# Patient Record
Sex: Male | Born: 1950 | Race: White | Hispanic: No | Marital: Married | Smoking: Former smoker
Health system: Southern US, Community
[De-identification: ages and names within clinical notes are randomized; demographics above are authoritative.]

## PROBLEM LIST (undated history)

## (undated) DIAGNOSIS — M160 Bilateral primary osteoarthritis of hip: Secondary | ICD-10-CM

## (undated) DIAGNOSIS — I739 Peripheral vascular disease, unspecified: Secondary | ICD-10-CM

## (undated) DIAGNOSIS — M1990 Unspecified osteoarthritis, unspecified site: Secondary | ICD-10-CM

## (undated) DIAGNOSIS — Z87442 Personal history of urinary calculi: Secondary | ICD-10-CM

## (undated) DIAGNOSIS — K219 Gastro-esophageal reflux disease without esophagitis: Secondary | ICD-10-CM

## (undated) DIAGNOSIS — N529 Male erectile dysfunction, unspecified: Secondary | ICD-10-CM

## (undated) DIAGNOSIS — I10 Essential (primary) hypertension: Secondary | ICD-10-CM

## (undated) DIAGNOSIS — F17200 Nicotine dependence, unspecified, uncomplicated: Secondary | ICD-10-CM

## (undated) DIAGNOSIS — I509 Heart failure, unspecified: Secondary | ICD-10-CM

## (undated) DIAGNOSIS — B0229 Other postherpetic nervous system involvement: Secondary | ICD-10-CM

## (undated) HISTORY — DX: Male erectile dysfunction, unspecified: N52.9

## (undated) HISTORY — PX: NO PAST SURGERIES: SHX2092

## (undated) HISTORY — DX: Essential (primary) hypertension: I10

## (undated) HISTORY — PX: COLONOSCOPY: SHX174

## (undated) HISTORY — DX: Personal history of urinary calculi: Z87.442

## (undated) HISTORY — DX: Heart failure, unspecified: I50.9

## (undated) HISTORY — DX: Other postherpetic nervous system involvement: B02.29

## (undated) HISTORY — DX: Unspecified osteoarthritis, unspecified site: M19.90

## (undated) HISTORY — DX: Bilateral primary osteoarthritis of hip: M16.0

## (undated) HISTORY — DX: Gastro-esophageal reflux disease without esophagitis: K21.9

## (undated) HISTORY — PX: MULTIPLE TOOTH EXTRACTIONS: SHX2053

## (undated) HISTORY — DX: Nicotine dependence, unspecified, uncomplicated: F17.200

## (undated) HISTORY — DX: Peripheral vascular disease, unspecified: I73.9

---

## 2016-03-04 ENCOUNTER — Encounter: Payer: Self-pay | Admitting: *Deleted

## 2016-03-04 ENCOUNTER — Other Ambulatory Visit: Payer: Self-pay | Admitting: *Deleted

## 2016-03-04 DIAGNOSIS — I739 Peripheral vascular disease, unspecified: Secondary | ICD-10-CM

## 2016-03-05 ENCOUNTER — Ambulatory Visit (HOSPITAL_COMMUNITY)
Admission: RE | Admit: 2016-03-05 | Discharge: 2016-03-05 | Disposition: A | Discharge status: Home / Self Care | Payer: Medicare Other | Source: Ambulatory Visit | Attending: Vascular Surgery | Admitting: Vascular Surgery

## 2016-03-05 ENCOUNTER — Encounter: Payer: Self-pay | Admitting: *Deleted

## 2016-03-05 ENCOUNTER — Encounter: Payer: Self-pay | Admitting: Vascular Surgery

## 2016-03-05 ENCOUNTER — Ambulatory Visit (INDEPENDENT_AMBULATORY_CARE_PROVIDER_SITE_OTHER): Payer: Medicare Other | Admitting: Vascular Surgery

## 2016-03-05 ENCOUNTER — Other Ambulatory Visit: Payer: Self-pay | Admitting: *Deleted

## 2016-03-05 VITALS — BP 131/83 | HR 87 | Temp 97.1°F | Resp 20 | Ht 71.0 in | Wt 132.3 lb

## 2016-03-05 DIAGNOSIS — I739 Peripheral vascular disease, unspecified: Secondary | ICD-10-CM | POA: Insufficient documentation

## 2016-03-05 DIAGNOSIS — I70213 Atherosclerosis of native arteries of extremities with intermittent claudication, bilateral legs: Secondary | ICD-10-CM | POA: Diagnosis not present

## 2016-03-05 LAB — VAS US LOWER EXTREMITY ARTERIAL DUPLEX
LPERODISTSYS: 29 cm/s
LSFMPSV: -52 cm/s
LSFPPSV: -68 cm/s
Left ant tibial distal sys: -10 cm/s
Left super femoral dist sys PSV: -37 cm/s
RATIBDISTSYS: -11 cm/s
RTIBDISTSYS: 28 cm/s
Right peroneal sys PSV: 22 cm/s
Right super femoral dist sys PSV: -39 cm/s
Right super femoral mid sys PSV: -57 cm/s
Right super femoral prox sys PSV: -61 cm/s
left post tibial dist sys: -26 cm/s

## 2016-03-05 NOTE — Progress Notes (Signed)
  Referred by:  Nathan Conroy, PA-C [address] [place], [place] [address]  Reason for referral: bilateral leg pain   History of Present Illness  Gregory Russell is a 65 y.o. (11/30/1950) male who presents with chief complaint: pain in thighs with climbing stairs and walking.   Onset of symptom occurred years ago, but worsening over last few months.  Pain is described as vague aching in hips and thighs, severity 5-10/10, and associated with climb steps and 1 block walking.  Patient has attempted to treat this pain with rest.  The patient has no rest pain symptoms also and no leg wounds/ulcers.  Atherosclerotic risk factors include: HTN, active smoking.   Past Medical History:  Diagnosis Date  . Arthritis   . CHF (congestive heart failure) (HCC)   . Erectile dysfunction   . GERD (gastroesophageal reflux disease)   . Hypertension   . Peripheral vascular disease (HCC)    claudication  . Personal history of kidney stones   . Post herpetic neuralgia   . Primary osteoarthritis of both hips   . Tobacco dependence     Past Surgical History:  Procedure Laterality Date  . NO PAST SURGERIES     as of 03-03-16    Social History   Social History  . Marital status: Married    Spouse name: N/A  . Number of children: N/A  . Years of education: N/A   Occupational History  . Not on file.   Social History Main Topics  . Smoking status: Current Every Day Smoker    Packs/day: 1.00  . Smokeless tobacco: Never Used  . Alcohol use No  . Drug use: No  . Sexual activity: Not on file   Other Topics Concern  . Not on file   Social History Narrative  . No narrative on file    Family History  Problem Relation Age of Onset  . Alzheimer's disease Mother   . Heart disease Father     Current Outpatient Prescriptions  Medication Sig Dispense Refill  . amLODipine (NORVASC) 10 MG tablet Take 10 mg by mouth daily.    . losartan (COZAAR) 50 MG tablet Take 100 mg by mouth daily.     .  omeprazole (PRILOSEC) 40 MG capsule Take 40 mg by mouth daily.    . sildenafil (VIAGRA) 100 MG tablet Take 100 mg by mouth daily as needed for erectile dysfunction.     No current facility-administered medications for this visit.     No Known Allergies   REVIEW OF SYSTEMS:   Cardiac:  positive for: no symptoms, negative for: Chest pain or chest pressure, Shortness of breath upon exertion and Shortness of breath when lying flat,   Vascular:  positive for: Pain in calf, thigh, or hip brought on by ambulation,  negative for: Pain in feet at night that wakes you up from your sleep, Blood clot in your veins and Leg swelling  Pulmonary:  positive for: no symptoms,  negative for: Oxygen at home, Productive cough and Wheezing  Neurologic:  positive for: No symptoms, negative for: Sudden weakness in arms or legs, Sudden numbness in arms or legs, Sudden onset of difficulty speaking or slurred speech, Temporary loss of vision in one eye and Problems with dizziness  Gastrointestinal:  positive for: no symptoms, negative for: Blood in stool and Vomited blood  Genitourinary:  positive for: no symptoms, negative for: Burning when urinating and Blood in urine  Psychiatric:  positive for: no symptoms,    negative for: Major depression  Hematologic:  positive for: no symptoms,  negative for: negative for: Bleeding problems and Problems with blood clotting too easily  Dermatologic:  positive for: no symptoms, negative for: Rashes or ulcers  Constitutional:  positive for: no symptoms, negative for: Fever or chills  Ear/Nose/Throat:  positive for: no symptoms, negative for: Change in hearing, Nose bleeds and Sore throat  Musculoskeletal:  positive for: no symptoms, negative for: Back pain, Joint pain and Muscle pain    For VQI Use Only  PRE-ADM LIVING: Home  AMB STATUS: Ambulatory  CAD Sx: None  PRIOR CHF: None  STRESS TEST: No   Physical Examination  Vitals:    03/05/16 0927  BP: 131/83  Pulse: 87  Resp: 20  Temp: 97.1 F (36.2 C)  TempSrc: Oral  SpO2: 98%  Weight: 132 lb 4.8 oz (60 kg)  Height: 5' 11" (1.803 m)    Body mass index is 18.45 kg/m.  General: Alert, O x 3, WD,NAD  Head: [place]/AT,   Ear/Nose/Throat: Hearing grossly intact, nares without erythema or drainage, oropharynx without Erythema or Exudate , Mallampati score: 3, Dentition intact  Eyes: PERRLA, EOMI,   Neck: Supple, mid-line trachea,    Pulmonary: Sym exp, good B air movt,CTA B  Cardiac: RRR, Nl S1, S2, no Murmurs, No rubs, No S3,S4  Vascular: Vessel Right Left  Radial Palpable Palpable  Brachial Palpable Palpable  Carotid Palpable, No Bruit Palpable, No Bruit  Aorta Not palpable N/A  Femoral Palpable Palpable (weaker than right)  Popliteal Not palpable Not palpable  PT Not palpable Not palpable  DP Faintly palpable Faintly palpable   Gastrointestinal: soft, non-distended, non-tender to palpation, No guarding or rebound, no HSM, no masses, no CVAT B, No palpable prominent aortic pulse,    Musculoskeletal: M/S 5/5 throughout  , Extremities without ischemic changes  , No edema present,  , No LDS present  Neurologic: CN 2-12 intact , Pain and light touch intact in extremities , Motor exam as listed above  Psychiatric: Judgement intact, Mood & affect appropriate for pt's clinical situation  Dermatologic: See M/S exam for extremity exam, No rashes otherwise noted  Lymph : Palpable lymph nodes: None   Non-Invasive Vascular Imaging  Outside ABI (Date: 01/28/16)  R:   ABI: 0.81  L:   ABI: 0.71  BLE arterial duplex (03/05/2016)  R:   CFA: bi  PFA: mono  SFA: bi  Pop: bi  PT: bi  Pero: bi  AT: mono  L:  CFA: mono  PFA: mono  SFA: mono-bi  Pop: mono  PT: mono  Pero: bi  AT: mono   Outside Studies/Documentation 10 pages of outside documents were reviewed including: outside PCP chart, ABI.  Medical Decision  Making  Gregory Russell is a 65 y.o. male who presents with: BLE short distance lifestyle limiting intermittent claudication.   Based on this patient's history and physical exam, I recommend: maximal medical mgmt.  The patient feels his sx are severe enough to justify proceeding with angiography, so we discussed: aortogram, bilateral leg runoff, and possible iliac intervention. I discussed with the patient the nature of angiographic procedures, especially the limited patencies of any endovascular intervention.   The patient is aware of that the risks of an angiographic procedure include but are not limited to: bleeding, infection, access site complications, renal failure, embolization, rupture of vessel, dissection, arteriovenous fistula, possible need for emergent surgical intervention, possible need for surgical procedures to treat the patient's pathology, anaphylactic   reaction to contrast, and stroke and death.   The patient is aware of the risks and agrees to proceed.  He is scheduled for 1 FEB 17  I discussed with the patient the natural history of intermittent claudication: 75% of patients have stable or improved symptoms in a year an only 2% require amputation. Eventually 20% may require intervention in a year.  I discussed in depth with the patient the nature of atherosclerosis, and emphasized the importance of maximal medical management including strict control of blood pressure, blood glucose, and lipid levels, antiplatelet agent, obtaining regular exercise, and cessation of smoking.    The patient is aware that without maximal medical management the underlying atherosclerotic disease process will progress, limiting the benefit of any interventions.  I discussed in depth with the patient a walking plan and how to execute such. The patient is currently not on a statin as not medically indicated. The patient is currently not on an anti-platelet.  Patient will be started on ASA 81 mg PO  daily  Thank you for allowing us to participate in this patient's care.   Kashus Karlen, MD, FACS [facility] Office: [phone] Pager: [phone]  03/05/2016, 11:43 AM   

## 2016-03-13 ENCOUNTER — Encounter (HOSPITAL_COMMUNITY): Payer: Self-pay | Admitting: Vascular Surgery

## 2016-03-13 ENCOUNTER — Encounter (HOSPITAL_COMMUNITY)
Admission: RE | Disposition: A | Discharge status: Home / Self Care | Payer: Self-pay | Source: Ambulatory Visit | Attending: Vascular Surgery

## 2016-03-13 ENCOUNTER — Ambulatory Visit (HOSPITAL_COMMUNITY)
Admission: RE | Admit: 2016-03-13 | Discharge: 2016-03-13 | Disposition: A | Discharge status: Home / Self Care | Payer: Medicare Other | Source: Ambulatory Visit | Attending: Vascular Surgery | Admitting: Vascular Surgery

## 2016-03-13 ENCOUNTER — Other Ambulatory Visit: Payer: Self-pay | Admitting: *Deleted

## 2016-03-13 DIAGNOSIS — M160 Bilateral primary osteoarthritis of hip: Secondary | ICD-10-CM | POA: Diagnosis not present

## 2016-03-13 DIAGNOSIS — Z8249 Family history of ischemic heart disease and other diseases of the circulatory system: Secondary | ICD-10-CM | POA: Diagnosis not present

## 2016-03-13 DIAGNOSIS — I70219 Atherosclerosis of native arteries of extremities with intermittent claudication, unspecified extremity: Secondary | ICD-10-CM

## 2016-03-13 DIAGNOSIS — F17210 Nicotine dependence, cigarettes, uncomplicated: Secondary | ICD-10-CM | POA: Insufficient documentation

## 2016-03-13 DIAGNOSIS — I70213 Atherosclerosis of native arteries of extremities with intermittent claudication, bilateral legs: Secondary | ICD-10-CM | POA: Insufficient documentation

## 2016-03-13 DIAGNOSIS — I110 Hypertensive heart disease with heart failure: Secondary | ICD-10-CM | POA: Insufficient documentation

## 2016-03-13 DIAGNOSIS — I509 Heart failure, unspecified: Secondary | ICD-10-CM | POA: Diagnosis not present

## 2016-03-13 DIAGNOSIS — K219 Gastro-esophageal reflux disease without esophagitis: Secondary | ICD-10-CM | POA: Diagnosis not present

## 2016-03-13 DIAGNOSIS — N529 Male erectile dysfunction, unspecified: Secondary | ICD-10-CM | POA: Diagnosis not present

## 2016-03-13 DIAGNOSIS — Z01810 Encounter for preprocedural cardiovascular examination: Secondary | ICD-10-CM

## 2016-03-13 DIAGNOSIS — Z87442 Personal history of urinary calculi: Secondary | ICD-10-CM | POA: Insufficient documentation

## 2016-03-13 DIAGNOSIS — I700 Atherosclerosis of aorta: Secondary | ICD-10-CM | POA: Insufficient documentation

## 2016-03-13 DIAGNOSIS — I739 Peripheral vascular disease, unspecified: Secondary | ICD-10-CM

## 2016-03-13 HISTORY — PX: PERIPHERAL VASCULAR CATHETERIZATION: SHX172C

## 2016-03-13 SURGERY — ABDOMINAL AORTOGRAM W/LOWER EXTREMITY
Anesthesia: LOCAL

## 2016-03-13 MED ORDER — SODIUM CHLORIDE 0.9 % IV SOLN: 1.0000 mL/kg/h | INTRAVENOUS | Status: DC

## 2016-03-13 MED ORDER — LIDOCAINE HCL (PF) 1 % IJ SOLN
INTRAMUSCULAR | Status: DC | PRN
  Administered 2016-03-13: 17 mL

## 2016-03-13 MED ORDER — FENTANYL CITRATE (PF) 100 MCG/2ML IJ SOLN
INTRAMUSCULAR | Status: DC | PRN
  Administered 2016-03-13: 25 ug via INTRAVENOUS

## 2016-03-13 MED ORDER — MIDAZOLAM HCL 2 MG/2ML IJ SOLN
INTRAMUSCULAR | Status: AC
  Filled 2016-03-13: qty 2

## 2016-03-13 MED ORDER — ASPIRIN 81 MG PO CHEW: 81.0000 mg | CHEWABLE_TABLET | Freq: Every day | ORAL | 3 refills | Status: AC

## 2016-03-13 MED ORDER — LIDOCAINE HCL (PF) 1 % IJ SOLN
INTRAMUSCULAR | Status: AC
  Filled 2016-03-13: qty 30

## 2016-03-13 MED ORDER — IODIXANOL 320 MG/ML IV SOLN
INTRAVENOUS | Status: DC | PRN
  Administered 2016-03-13: 97 mL via INTRA_ARTERIAL

## 2016-03-13 MED ORDER — MIDAZOLAM HCL 2 MG/2ML IJ SOLN
INTRAMUSCULAR | Status: DC | PRN
  Administered 2016-03-13: 0.5 mg via INTRAVENOUS

## 2016-03-13 MED ORDER — FENTANYL CITRATE (PF) 100 MCG/2ML IJ SOLN
INTRAMUSCULAR | Status: AC
  Filled 2016-03-13: qty 2

## 2016-03-13 MED ORDER — SODIUM CHLORIDE 0.9 % IV SOLN
INTRAVENOUS | Status: DC
  Administered 2016-03-13: 08:00:00 via INTRAVENOUS

## 2016-03-13 MED ORDER — HEPARIN (PORCINE) IN NACL 2-0.9 UNIT/ML-% IJ SOLN
INTRAMUSCULAR | Status: AC
  Filled 2016-03-13: qty 1000

## 2016-03-13 MED ORDER — HEPARIN (PORCINE) IN NACL 2-0.9 UNIT/ML-% IJ SOLN
INTRAMUSCULAR | Status: DC | PRN
  Administered 2016-03-13: 1000 mL

## 2016-03-13 SURGICAL SUPPLY — 9 items
CATH OMNI FLUSH 5F 65CM (CATHETERS) ×4 IMPLANT
COVER PRB 48X5XTLSCP FOLD TPE (BAG) ×1 IMPLANT
COVER PROBE 5X48 (BAG) ×1
KIT PV (KITS) ×2 IMPLANT
SHEATH PINNACLE 5F 10CM (SHEATH) ×2 IMPLANT
SYR MEDRAD MARK V 150ML (SYRINGE) ×2 IMPLANT
TRANSDUCER W/STOPCOCK (MISCELLANEOUS) ×2 IMPLANT
TRAY PV CATH (CUSTOM PROCEDURE TRAY) ×2 IMPLANT
WIRE BENTSON .035X145CM (WIRE) ×2 IMPLANT

## 2016-03-13 NOTE — Interval H&P Note (Signed)
[facility]  History and Physical Update  The patient was interviewed and re-examined.  The patient's previous History and Physical has been reviewed and is unchanged from my consult.  There is no change in the plan of care: aortogram, bilateral leg runoff, and possible iliac intervention.   I discussed with the patient the nature of angiographic procedures, especially the limited patencies of any endovascular intervention.    The patient is aware of that the risks of an angiographic procedure include but are not limited to: bleeding, infection, access site complications, renal failure, embolization, rupture of vessel, dissection, arteriovenous fistula, possible need for emergent surgical intervention, possible need for surgical procedures to treat the patient's pathology, anaphylactic reaction to contrast, and stroke and death.    The patient is aware of the risks and agrees to proceed.   Leonides SakeBrian Chen, MD [facility] [place] Office: [phone] Pager: [phone]  03/13/2016, 7:39 AM

## 2016-03-13 NOTE — H&P (View-Only) (Signed)
Referred by:  Lonie Peak, PA-C [address] [place], [place] [address]  Reason for referral: bilateral leg pain   History of Present Illness  Gregory Russell is a 66 y.o. (1950-04-05) male who presents with chief complaint: pain in thighs with climbing stairs and walking.   Onset of symptom occurred years ago, but worsening over last few months.  Pain is described as vague aching in hips and thighs, severity 5-10/10, and associated with climb steps and 1 block walking.  Patient has attempted to treat this pain with rest.  The patient has no rest pain symptoms also and no leg wounds/ulcers.  Atherosclerotic risk factors include: HTN, active smoking.   Past Medical History:  Diagnosis Date  . Arthritis   . CHF (congestive heart failure) (HCC)   . Erectile dysfunction   . GERD (gastroesophageal reflux disease)   . Hypertension   . Peripheral vascular disease (HCC)    claudication  . Personal history of kidney stones   . Post herpetic neuralgia   . Primary osteoarthritis of both hips   . Tobacco dependence     Past Surgical History:  Procedure Laterality Date  . NO PAST SURGERIES     as of 03-03-16    Social History   Social History  . Marital status: Married    Spouse name: N/A  . Number of children: N/A  . Years of education: N/A   Occupational History  . Not on file.   Social History Main Topics  . Smoking status: Current Every Day Smoker    Packs/day: 1.00  . Smokeless tobacco: Never Used  . Alcohol use No  . Drug use: No  . Sexual activity: Not on file   Other Topics Concern  . Not on file   Social History Narrative  . No narrative on file    Family History  Problem Relation Age of Onset  . Alzheimer's disease Mother   . Heart disease Father     Current Outpatient Prescriptions  Medication Sig Dispense Refill  . amLODipine (NORVASC) 10 MG tablet Take 10 mg by mouth daily.    Marland Kitchen losartan (COZAAR) 50 MG tablet Take 100 mg by mouth daily.     Marland Kitchen  omeprazole (PRILOSEC) 40 MG capsule Take 40 mg by mouth daily.    . sildenafil (VIAGRA) 100 MG tablet Take 100 mg by mouth daily as needed for erectile dysfunction.     No current facility-administered medications for this visit.     No Known Allergies   REVIEW OF SYSTEMS:   Cardiac:  positive for: no symptoms, negative for: Chest pain or chest pressure, Shortness of breath upon exertion and Shortness of breath when lying flat,   Vascular:  positive for: Pain in calf, thigh, or hip brought on by ambulation,  negative for: Pain in feet at night that wakes you up from your sleep, Blood clot in your veins and Leg swelling  Pulmonary:  positive for: no symptoms,  negative for: Oxygen at home, Productive cough and Wheezing  Neurologic:  positive for: No symptoms, negative for: Sudden weakness in arms or legs, Sudden numbness in arms or legs, Sudden onset of difficulty speaking or slurred speech, Temporary loss of vision in one eye and Problems with dizziness  Gastrointestinal:  positive for: no symptoms, negative for: Blood in stool and Vomited blood  Genitourinary:  positive for: no symptoms, negative for: Burning when urinating and Blood in urine  Psychiatric:  positive for: no symptoms,  negative for: Major depression  Hematologic:  positive for: no symptoms,  negative for: negative for: Bleeding problems and Problems with blood clotting too easily  Dermatologic:  positive for: no symptoms, negative for: Rashes or ulcers  Constitutional:  positive for: no symptoms, negative for: Fever or chills  Ear/Nose/Throat:  positive for: no symptoms, negative for: Change in hearing, Nose bleeds and Sore throat  Musculoskeletal:  positive for: no symptoms, negative for: Back pain, Joint pain and Muscle pain    For VQI Use Only  PRE-ADM LIVING: Home  AMB STATUS: Ambulatory  CAD Sx: None  PRIOR CHF: None  STRESS TEST: No   Physical Examination  Vitals:    03/05/16 0927  BP: 131/83  Pulse: 87  Resp: 20  Temp: 97.1 F (36.2 C)  TempSrc: Oral  SpO2: 98%  Weight: 132 lb 4.8 oz (60 kg)  Height: 5\' 11"  (1.803 m)    Body mass index is 18.45 kg/m.  General: Alert, O x 3, WD,NAD  Head: [place]/AT,   Ear/Nose/Throat: Hearing grossly intact, nares without erythema or drainage, oropharynx without Erythema or Exudate , Mallampati score: 3, Dentition intact  Eyes: PERRLA, EOMI,   Neck: Supple, mid-line trachea,    Pulmonary: Sym exp, good B air movt,CTA B  Cardiac: RRR, Nl S1, S2, no Murmurs, No rubs, No S3,S4  Vascular: Vessel Right Left  Radial Palpable Palpable  Brachial Palpable Palpable  Carotid Palpable, No Bruit Palpable, No Bruit  Aorta Not palpable N/A  Femoral Palpable Palpable (weaker than right)  Popliteal Not palpable Not palpable  PT Not palpable Not palpable  DP Faintly palpable Faintly palpable   Gastrointestinal: soft, non-distended, non-tender to palpation, No guarding or rebound, no HSM, no masses, no CVAT B, No palpable prominent aortic pulse,    Musculoskeletal: M/S 5/5 throughout  , Extremities without ischemic changes  , No edema present,  , No LDS present  Neurologic: CN 2-12 intact , Pain and light touch intact in extremities , Motor exam as listed above  Psychiatric: Judgement intact, Mood & affect appropriate for pt's clinical situation  Dermatologic: See M/S exam for extremity exam, No rashes otherwise noted  Lymph : Palpable lymph nodes: None   Non-Invasive Vascular Imaging  Outside ABI (Date: 01/28/16)  R:   ABI: 0.81  L:   ABI: 0.71  BLE arterial duplex (03/05/2016)  R:   CFA: bi  PFA: mono  SFA: bi  Pop: bi  PT: bi  Pero: bi  AT: mono  L:  CFA: mono  PFA: mono  SFA: mono-bi  Pop: mono  PT: mono  Pero: bi  AT: mono   Outside Studies/Documentation 10 pages of outside documents were reviewed including: outside PCP chart, ABI.  Medical Decision  Making  Gregory Russell is a 66 y.o. male who presents with: BLE short distance lifestyle limiting intermittent claudication.   Based on this patient's history and physical exam, I recommend: maximal medical mgmt.  The patient feels his sx are severe enough to justify proceeding with angiography, so we discussed: aortogram, bilateral leg runoff, and possible iliac intervention. I discussed with the patient the nature of angiographic procedures, especially the limited patencies of any endovascular intervention.   The patient is aware of that the risks of an angiographic procedure include but are not limited to: bleeding, infection, access site complications, renal failure, embolization, rupture of vessel, dissection, arteriovenous fistula, possible need for emergent surgical intervention, possible need for surgical procedures to treat the patient's pathology, anaphylactic  reaction to contrast, and stroke and death.   The patient is aware of the risks and agrees to proceed.  He is scheduled for 1 FEB 17  I discussed with the patient the natural history of intermittent claudication: 75% of patients have stable or improved symptoms in a year an only 2% require amputation. Eventually 20% may require intervention in a year.  I discussed in depth with the patient the nature of atherosclerosis, and emphasized the importance of maximal medical management including strict control of blood pressure, blood glucose, and lipid levels, antiplatelet agent, obtaining regular exercise, and cessation of smoking.    The patient is aware that without maximal medical management the underlying atherosclerotic disease process will progress, limiting the benefit of any interventions.  I discussed in depth with the patient a walking plan and how to execute such. The patient is currently not on a statin as not medically indicated. The patient is currently not on an anti-platelet.  Patient will be started on ASA 81 mg PO  daily  Thank you for allowing [place] to participate in this patient's care.   Leonides Sake, MD, FACS [facility] Office: [phone] Pager: [phone]  03/05/2016, 11:43 AM

## 2016-03-13 NOTE — Progress Notes (Signed)
Site area: Right groin a 5 french arterial sheet was removed  Site Prior to Removal:  Level 0  Pressure Applied For 15 MINUTES    Bedrest Beginning at 0950a  Manual:   Yes.    Patient Status During Pull:  stable  Post Pull Groin Site:  Level 0  Post Pull Instructions Given:  Yes.    Post Pull Pulses Present:  Yes.    Dressing Applied:  Yes.    Comments:  VS remain stable during sheath pull.

## 2016-03-13 NOTE — Discharge Instructions (Signed)

## 2016-03-13 NOTE — Op Note (Signed)
OPERATIVE NOTE   PROCEDURE: 1.  Right common femoral artery cannulation under ultrasound guidance 2.  Placement of catheter in aorta 3.  Aortogram 4.  Conscious sedation for 14 minutes 5.  Bilateral leg runoff via catheter  PRE-OPERATIVE DIAGNOSIS: lifestyle limiting intermittent claudication   POST-OPERATIVE DIAGNOSIS: same as above   SURGEON: Leonides SakeBrian Jrue Yambao, MD  ANESTHESIA: conscious sedation  ESTIMATED BLOOD LOSS: 30 cc  CONTRAST: 97 cc  FINDING(S):  Aorta: patent, distal aorta tapers (too small for two stents), heavy calcified distally  Superior mesenteric artery: not visualized  Celiac artery: not visualized Inferior mesenteric artery: patent, origin appears to be tapered   Right Left  RA patent patent  CIA Patent, 75-90% takeoff stenosis Patent, 75-90% takeoff stenosis    EIA Patent, somewhat underfilled Patent  IIA patent Patent  CFA Patent Patent  SFA Patent Patent  PFA Patent Patent  Pop Patent Patent  Trif Patent Patent, hypertrophied,   AT Patent proximally, dwindles distally Patent, incompletely visualized distally as as superimposed with peroneal artery  Pero Patent, underfills distally Patent, dominant runoff  PT Patent, dominant runoff Occluded, no obvious takeoff   SPECIMEN(S):  none  INDICATIONS:   Gregory Russell is a 66 y.o. male who presents with lifestyle limiting short distance intermittent claudication.  The patient presents for: aortogram, bilateral leg runoff, and possible intervention.  I discussed with the patient the nature of angiographic procedures, especially the limited patencies of any endovascular intervention.  The patient is aware of that the risks of an angiographic procedure include but are not limited to: bleeding, infection, access site complications, renal failure, embolization, rupture of vessel, dissection, possible need for emergent surgical intervention, possible need for surgical procedures to treat the patient's pathology,  and stroke and death.  The patient is aware of the risks and agrees to proceed.  DESCRIPTION: After full informed consent was obtained from the patient, the patient was brought back to the angiography suite.  The patient was placed supine upon the angiography table and connected to cardiopulmonary monitoring equipment.  The patient was then given conscious sedation, the amounts of which are documented in the patient's chart.  A circulating radiologic technician maintained continuous monitoring of the patient's cardiopulmonary status.  Additionally, the control room radiologic technician provided backup monitoring throughout the procedure.  The patient was prepped and drape in the standard fashion for an angiographic procedure.  At this point, attention was turned to the right groin.  Under ultrasound guidance,  The subcutaneous tissue surrounding the right common femoral artery was anesthesized with 1% lidocaine with epinephrine.  The artery was then cannulated with a 18 gauge needle.  The Bentson wire was passed up into the aorta.  The needle was exchanged for a 5-Fr sheath, which was advanced over the wire into the common femoral artery.  The dilator was then removed.  The Omniflush catheter was then loaded over the wire up to the level of L1.  The catheter was connected to the power injector circuit.  After de-airring and de-clotting the circuit, a power injector aortogram was completed.  The findings are listed above.  Based on a distal aortic measurement, 9 mm at 1 cm proximal to the severe aortic stenosis, it is not possible to place two iliac stents without possibly splitting the aorta.  I pulled the catheter down to the distal aorta.  An automated bilateral leg runoff was completed.  I replaced the wire into the catheter, straightening out the crook in the catheter.  The findings are listed above.  Both were removed from the sheath together.  The sheath was aspirated.  No clots were present and the  sheath was reloaded with heparinized saline.    Based on the images, the patient might be a candidate for aorto-bi-iliac bypass versus aortobifemoral bypass.   COMPLICATIONS: none  CONDITION: stable  Leonides Sake, MD, [facility] Office: [phone] Pager: [phone]  03/13/2016, 9:32 AM

## 2016-03-14 LAB — POCT I-STAT, CHEM 8
BUN: 23 mg/dL — ABNORMAL HIGH (ref 6–20)
CHLORIDE: 103 mmol/L (ref 101–111)
CREATININE: 1 mg/dL (ref 0.61–1.24)
Calcium, Ion: 1.21 mmol/L (ref 1.15–1.40)
GLUCOSE: 99 mg/dL (ref 65–99)
HCT: 47 % (ref 39.0–52.0)
Hemoglobin: 16 g/dL (ref 13.0–17.0)
POTASSIUM: 3.8 mmol/L (ref 3.5–5.1)
Sodium: 140 mmol/L (ref 135–145)
TCO2: 29 mmol/L (ref 0–100)

## 2016-04-08 ENCOUNTER — Telehealth: Payer: Self-pay | Admitting: Vascular Surgery

## 2016-04-08 NOTE — Telephone Encounter (Signed)
Per Dr. Nicky Pughhen's request, patient was referred to Dr[facility] Eldred MangesPrabhat Kumar [facility] to have pre-op clearance. The patient had this appointment on 2/8 and notes should be faxed to our office.

## 2016-04-21 ENCOUNTER — Encounter: Payer: Self-pay | Admitting: Vascular Surgery

## 2016-04-28 NOTE — Progress Notes (Signed)
Established Intermittent Claudication  History of Present Illness  Gregory Russell is a 66 y.o. (1950-12-11) male who presents with chief complaint: lifestyle limiting claudication.  The patient's symptoms have progressed.  The patient's symptoms are: severe short distance intermittent claudication which limits his ability to work.  The patiCorey Russell's treatment regimen currently included: maximal medical management.  The patient returns from his Cardiologist with a normal nuclear stress testing.  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  Based on imaging, the patient was a candidate for an aorto-bi-iliac vs ABF bypass.    Past Medical History:  Diagnosis Date  . Arthritis   . CHF (congestive heart failure) (HCC)   . Erectile dysfunction   . GERD (gastroesophageal reflux disease)   . Hypertension   . Peripheral vascular disease (HCC)    claudication  . Personal history of kidney stones   . Post herpetic neuralgia   . Primary osteoarthritis of both hips   . Tobacco dependence     Past Surgical History:  Procedure Laterality Date  . NO PAST SURGERIES     as of 03-03-16  . PERIPHERAL VASCULAR CATHETERIZATION N/A 03/13/2016   Procedure: Abdominal Aortogram w/Lower Extremity;  Surgeon: Gregory HertzBrian L Augusta Mirkin, MD;  [facility];  Service: Cardiovascular;  Laterality: N/A;    Social History   Social History  . Marital status: Married    Spouse name: N/A  . Number of children: N/A  . Years of education: N/A   Occupational History  . Not on file.   Social History Main Topics  . Smoking status: Current Every Day Smoker    Packs/day: 1.00  . Smokeless tobacco: Never Used  . Alcohol use No  . Drug use: No  . Sexual activity: Not on file   Other Topics Concern  . Not on file   Social History Narrative  . No narrative on file    Family History  Problem Relation Age of Onset  . Alzheimer's  disease Mother   . Heart disease Father     Current Outpatient Prescriptions  Medication Sig Dispense Refill  . amLODipine (NORVASC) 10 MG tablet Take 10 mg by mouth daily.    Marland Kitchen. aspirin (ASPIRIN CHILDRENS) 81 MG chewable tablet Chew 1 tablet (81 mg total) by mouth daily. 90 tablet 3  . losartan (COZAAR) 50 MG tablet Take 100 mg by mouth daily.     Marland Kitchen. omeprazole (PRILOSEC) 40 MG capsule Take 40 mg by mouth daily.    . sildenafil (VIAGRA) 100 MG tablet Take 100 mg by mouth daily as needed for erectile dysfunction.     No current facility-administered medications for this visit.      No Known Allergies   REVIEW OF SYSTEMS:  (Positives checked otherwise negative)  CARDIOVASCULAR:   [ ]  chest pain,  [ ]  chest pressure,  [ ]  palpitations,  [ ]  shortness of breath when laying flat,  [ ]  shortness of breath with exertion,   [x]  pain in feet when walking,  [ ]  pain in feet when laying flat, [ ]  history of blood clot in veins (DVT),  [ ]  history of phlebitis,  [ ]  swelling in legs,  [ ]  varicose veins  PULMONARY:   [ ]  productive cough,  [ ]  asthma,  [ ]  wheezing  NEUROLOGIC:   [ ]  weakness in arms or legs,  [ ]  numbness in arms or legs,  [ ]   difficulty speaking or slurred speech,  [ ]  temporary loss of vision in one eye,  [ ]  dizziness  HEMATOLOGIC:   [ ]  bleeding problems,  [ ]  problems with blood clotting too easily  MUSCULOSKEL:   [ ]  joint pain, [ ]  joint swelling  GASTROINTEST:   [ ]  vomiting blood,  [ ]  blood in stool     GENITOURINARY:   [ ]  burning with urination,  [ ]  blood in urine  PSYCHIATRIC:   [ ]  history of major depression  INTEGUMENTARY:   [ ]  rashes,  [ ]  ulcers  CONSTITUTIONAL:   [ ]  fever,  [ ]  chills   Physical Examination  Vitals:   05/02/16 1318  BP: 114/75  Pulse: 83  Resp: 16  Temp: 97.8 F (36.6 C)  TempSrc: Oral  SpO2: 94%  Weight: 137 lb (62.1 kg)  Height: 5\' 11"  (1.803 m)    Body mass index is 19.11  kg/m.  General: Alert, O x 3, WD,NAD  Pulmonary: Sym exp, good B air movt,CTA B  Cardiac: RRR, Nl S1, S2, no Murmurs, No rubs, No S3,S4  Vascular: Vessel Right Left  Radial Palpable Palpable  Brachial Palpable Palpable  Carotid Palpable, No Bruit Palpable, No Bruit  Aorta Not palpable N/A  Femoral Faintly palpable Faintly palpable  Popliteal Not palpable Not palpable  PT Not palpable Not palpable  DP Not palpable Not palpable   Gastrointestinal: soft, non-distended, non-tender to palpation, No guarding or rebound, no HSM, no masses, no CVAT B, No palpable prominent aortic pulse,    Musculoskeletal: M/S 5/5 throughout  , Extremities without ischemic changes  , No edema present,  , No LDS present  Neurologic: CN 2-12 intact , Pain and light touch intact in extremities , Motor exam as listed above   Non-Invasive Vascular Imaging Outside ABI R: 0.81 L: 0.71   Medical Decision Making  Gregory Russell is a 66 y.o. 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   Given this patient's anatomy, when his distal aorta occludes, he will likely have bilateral threatened limb.  To avoid this potentially fatal outcome, I have offered the patient: aortobi-iliac bypass, possible distal aortic endarterectomy, possible aortobifemoral bypass.  The patient is aware that his anatomy will determine the procedure completed intraoperatively. The patient is aware the risks of aortic surgery include but are not limited to: bleeding, need for transfusion, infection, death, stroke, paralysis, wound complications, bowel injuries, impotence, bowel ischemia, extended ventilation and future ventral hernias.   Overall, I cited a mortality rate of 5-10% and morbidity rate of 30%..  I discussed in depth with the patient the nature of  atherosclerosis, and emphasized the importance of maximal medical management including strict control of blood pressure, blood glucose, and lipid levels, antiplatelet agents, obtaining regular exercise, and cessation of smoking.    The patient is aware that without maximal medical management the underlying atherosclerotic disease process will progress, limiting the benefit of any interventions. The patient is currently not on a statin. Patient will be started on Lipitor 10 mg PO daily, to be titrated and managed by their primary physician.  The patient is currently on an anti-platelet: ASA.  Smoking cessation was emphasized to this patient.  The patient is aware he is at higher risk of mortality, morbidities, and decreased patency with continued smoking.  Thank you for allowing  [place] to participate in this patient's care.   Leonides Sake, MD, FACS [facility] [place] Office: [phone] Pager: [phone]

## 2016-05-02 ENCOUNTER — Other Ambulatory Visit: Payer: Self-pay

## 2016-05-02 ENCOUNTER — Ambulatory Visit (INDEPENDENT_AMBULATORY_CARE_PROVIDER_SITE_OTHER): Payer: Medicare Other | Admitting: Vascular Surgery

## 2016-05-02 ENCOUNTER — Encounter: Payer: Self-pay | Admitting: Vascular Surgery

## 2016-05-02 VITALS — BP 114/75 | HR 83 | Temp 97.8°F | Resp 16 | Ht 71.0 in | Wt 137.0 lb

## 2016-05-02 DIAGNOSIS — I70213 Atherosclerosis of native arteries of extremities with intermittent claudication, bilateral legs: Secondary | ICD-10-CM

## 2016-05-05 ENCOUNTER — Encounter (HOSPITAL_COMMUNITY): Payer: Self-pay | Admitting: *Deleted

## 2016-05-05 MED ORDER — SODIUM CHLORIDE 0.9 % IV SOLN: INTRAVENOUS | Status: DC

## 2016-05-05 MED ORDER — DEXTROSE 5 % IV SOLN
1.5000 g | INTRAVENOUS | Status: AC
  Administered 2016-05-06 (×2): 1.5 g via INTRAVENOUS
  Filled 2016-05-05: qty 1.5

## 2016-05-05 NOTE — Anesthesia Preprocedure Evaluation (Signed)
Anesthesia Evaluation  Patient identified by MRN, date of birth, ID band Patient awake    Reviewed: Allergy & Precautions, NPO status , Patient's Chart, lab work & pertinent test results  Airway Mallampati: II  TM Distance: >3 FB Neck ROM: Full    Dental no notable dental hx.    Pulmonary neg pulmonary ROS, Current Smoker,    Pulmonary exam normal breath sounds clear to auscultation       Cardiovascular hypertension, Pt. on medications + Peripheral Vascular Disease and +CHF  Normal cardiovascular exam Rhythm:Regular Rate:Normal     Neuro/Psych negative neurological ROS  negative psych ROS   GI/Hepatic Neg liver ROS, GERD  ,  Endo/Other  negative endocrine ROS  Renal/GU negative Renal ROS     Musculoskeletal negative musculoskeletal ROS (+)   Abdominal   Peds  Hematology negative hematology ROS (+)   Anesthesia Other Findings   Reproductive/Obstetrics                             Anesthesia Physical Anesthesia Plan  ASA: III  Anesthesia Plan: General   Post-op Pain Management:    Induction: Intravenous  Airway Management Planned: Oral ETT  Additional Equipment: Arterial line and CVP  Intra-op Plan:   Post-operative Plan: Extubation in OR  Informed Consent: I have reviewed the patients History and Physical, chart, labs and discussed the procedure including the risks, benefits and alternatives for the proposed anesthesia with the patient or authorized representative who has indicated his/her understanding and acceptance.   Dental advisory given  Plan Discussed with: CRNA  Anesthesia Plan Comments:         Anesthesia Quick Evaluation

## 2016-05-05 NOTE — Progress Notes (Signed)
Anesthesia Chart Review:  Pt is a same day work up.   Pt is a 66 year old male scheduled for B aorto bi-iliac bypass on 05/06/2016 with Leonides SakeBrian Chen, MD  - PCP is Lonie PeakNathan Conroy, PA - Pt saw cardiologist Eldred MangesPrabhat Kumar, MD (notes in care everywhere) for pre-op evaluation. Echo and stress test ordered, results below.  Dr. Lucianne MussKumar cleared pt for surgery.   PMH includes:  CHF, PVD, HTN, GERD. Current smoker. BMI 19  Medications include: amlodipine, ASA 81mg , losartan, prilosec, sildenafil.   Labs will be obtained DOS.   EKG 03/13/16: NSR with sinus arrhythmia. Poor R wave progression  Nuclear stress test 04/02/16 (care everywhere):  - Low risk study - No significant ischemia is noted on perfusion imaging - There is a large in size, moderate in severity, fixed defect involving the apical, apical anterior, apical septal, apical inferior and mid inferoseptal segments. This is consistent with probable attenuation artifact. - Post stress: Global systolic function is normal. The ejection fraction calculated at 62%.  - Sensitivity and specificity of this test are reduced by the noted attenuation and motion  Echo 03/25/16 (care everywhere): - Normal left ventricular systolic function, ejection fraction 65% - Normal right ventricular systolic function  - Diastolic dysfunction - grade I (normal filling pressures)  If labs acceptable DOS, I anticipate pt can proceed as scheduled.   Rica Mastngela Jazzma Neidhardt, FNP-BC [facility]iology Phone: [phone] 05/05/2016 3:36 PM

## 2016-05-05 NOTE — Progress Notes (Signed)
Pt denies SOB and chest pain. Pt under the care of Dr. Lucianne MussKumar, Cardiology. Pt denies having a cardiac cath. [facility]. Pt made aware to stop taking  Aspirin, vitamins, fish oil and herbal medications. Do not take any NSAIDs ie: Ibuprofen, Advil, Naproxen, BC and Goody Powder. Pt verbalized understanding of all pre-op instructions. Anesthesia asked to review pt history.

## 2016-05-06 ENCOUNTER — Inpatient Hospital Stay (HOSPITAL_COMMUNITY): Payer: Medicare Other | Admitting: Anesthesiology

## 2016-05-06 ENCOUNTER — Inpatient Hospital Stay (HOSPITAL_COMMUNITY)
Admission: RE | Admit: 2016-05-06 | Discharge: 2016-05-11 | DRG: 269 | Disposition: A | Discharge status: Home Health | Payer: Medicare Other | Source: Ambulatory Visit | Attending: Vascular Surgery | Admitting: Vascular Surgery

## 2016-05-06 ENCOUNTER — Inpatient Hospital Stay (HOSPITAL_COMMUNITY): Payer: Medicare Other

## 2016-05-06 ENCOUNTER — Encounter (HOSPITAL_COMMUNITY)
Admission: RE | Disposition: A | Discharge status: Home Health | Payer: Self-pay | Source: Ambulatory Visit | Attending: Vascular Surgery

## 2016-05-06 DIAGNOSIS — I70213 Atherosclerosis of native arteries of extremities with intermittent claudication, bilateral legs: Secondary | ICD-10-CM | POA: Diagnosis present

## 2016-05-06 DIAGNOSIS — I350 Nonrheumatic aortic (valve) stenosis: Secondary | ICD-10-CM | POA: Diagnosis present

## 2016-05-06 DIAGNOSIS — I771 Stricture of artery: Secondary | ICD-10-CM | POA: Diagnosis not present

## 2016-05-06 DIAGNOSIS — I509 Heart failure, unspecified: Secondary | ICD-10-CM | POA: Diagnosis present

## 2016-05-06 DIAGNOSIS — Z79899 Other long term (current) drug therapy: Secondary | ICD-10-CM

## 2016-05-06 DIAGNOSIS — M160 Bilateral primary osteoarthritis of hip: Secondary | ICD-10-CM | POA: Diagnosis present

## 2016-05-06 DIAGNOSIS — Z7982 Long term (current) use of aspirin: Secondary | ICD-10-CM

## 2016-05-06 DIAGNOSIS — I110 Hypertensive heart disease with heart failure: Secondary | ICD-10-CM | POA: Diagnosis present

## 2016-05-06 DIAGNOSIS — F17210 Nicotine dependence, cigarettes, uncomplicated: Secondary | ICD-10-CM | POA: Diagnosis present

## 2016-05-06 DIAGNOSIS — Z9889 Other specified postprocedural states: Secondary | ICD-10-CM

## 2016-05-06 DIAGNOSIS — K219 Gastro-esophageal reflux disease without esophagitis: Secondary | ICD-10-CM | POA: Diagnosis present

## 2016-05-06 DIAGNOSIS — I739 Peripheral vascular disease, unspecified: Secondary | ICD-10-CM | POA: Diagnosis present

## 2016-05-06 HISTORY — PX: AORTOILIAC BYPASS: SHX6417

## 2016-05-06 LAB — COMPREHENSIVE METABOLIC PANEL
ALK PHOS: 85 U/L (ref 38–126)
ALT: 22 U/L (ref 17–63)
ANION GAP: 13 (ref 5–15)
AST: 19 U/L (ref 15–41)
Albumin: 3.9 g/dL (ref 3.5–5.0)
BUN: 18 mg/dL (ref 6–20)
CALCIUM: 9.5 mg/dL (ref 8.9–10.3)
CO2: 21 mmol/L — AB (ref 22–32)
Chloride: 104 mmol/L (ref 101–111)
Creatinine, Ser: 1.05 mg/dL (ref 0.61–1.24)
GFR calc non Af Amer: >60 mL/min (ref >60)
Glucose, Bld: 104 mg/dL — ABNORMAL HIGH (ref 65–99)
Potassium: 4.2 mmol/L (ref 3.5–5.1)
SODIUM: 138 mmol/L (ref 135–145)
Total Bilirubin: 0.4 mg/dL (ref 0.3–1.2)
Total Protein: 6.4 g/dL — ABNORMAL LOW (ref 6.5–8.1)

## 2016-05-06 LAB — SURGICAL PCR SCREEN
MRSA, PCR: NEGATIVE
Staphylococcus aureus: NEGATIVE

## 2016-05-06 LAB — MAGNESIUM: Magnesium: 1.5 mg/dL — ABNORMAL LOW (ref 1.7–2.4)

## 2016-05-06 LAB — PREPARE RBC (CROSSMATCH)

## 2016-05-06 LAB — ABO/RH: ABO/RH(D): O POS

## 2016-05-06 LAB — CBC
HCT: 34.2 % — ABNORMAL LOW (ref 39.0–52.0)
HEMATOCRIT: 45.4 % (ref 39.0–52.0)
HEMOGLOBIN: 15.7 g/dL (ref 13.0–17.0)
HEMOGLOBIN: 11.5 g/dL — AB (ref 13.0–17.0)
MCH: 31 pg (ref 26.0–34.0)
MCH: 30.5 pg (ref 26.0–34.0)
MCHC: 34.6 g/dL (ref 30.0–36.0)
MCHC: 33.6 g/dL (ref 30.0–36.0)
MCV: 89.7 fL (ref 78.0–100.0)
MCV: 90.7 fL (ref 78.0–100.0)
PLATELETS: 181 10*3/uL (ref 150–400)
Platelets: 234 10*3/uL (ref 150–400)
RBC: 5.06 MIL/uL (ref 4.22–5.81)
RBC: 3.77 MIL/uL — ABNORMAL LOW (ref 4.22–5.81)
RDW: 14 % (ref 11.5–15.5)
RDW: 14.2 % (ref 11.5–15.5)
WBC: 8.4 10*3/uL (ref 4.0–10.5)
WBC: 12.6 10*3/uL — ABNORMAL HIGH (ref 4.0–10.5)

## 2016-05-06 LAB — PROTIME-INR
INR: 0.95
INR: 1.09
PROTHROMBIN TIME: 14.1 s (ref 11.4–15.2)
Prothrombin Time: 12.7 seconds (ref 11.4–15.2)

## 2016-05-06 LAB — APTT
aPTT: 33 seconds (ref 24–36)
aPTT: 36 seconds (ref 24–36)

## 2016-05-06 LAB — BASIC METABOLIC PANEL
Anion gap: 9 (ref 5–15)
BUN: 14 mg/dL (ref 6–20)
CALCIUM: 9.2 mg/dL (ref 8.9–10.3)
CO2: 28 mmol/L (ref 22–32)
CREATININE: 1.04 mg/dL (ref 0.61–1.24)
Chloride: 104 mmol/L (ref 101–111)
Glucose, Bld: 134 mg/dL — ABNORMAL HIGH (ref 65–99)
Potassium: 4.1 mmol/L (ref 3.5–5.1)
SODIUM: 141 mmol/L (ref 135–145)

## 2016-05-06 SURGERY — CREATION, BYPASS, ARTERIAL, AORTA TO ILIAC, USING GRAFT
Anesthesia: General | Site: Abdomen | Laterality: Bilateral

## 2016-05-06 MED ORDER — NOREPINEPHRINE BITARTRATE 1 MG/ML IV SOLN
0.0000 ug/min | INTRAVENOUS | Status: AC
  Administered 2016-05-06: 3 ug/min via INTRAVENOUS
  Filled 2016-05-06: qty 4

## 2016-05-06 MED ORDER — AMLODIPINE BESYLATE 10 MG PO TABS
10.0000 mg | ORAL_TABLET | Freq: Every day | ORAL | Status: DC
  Administered 2016-05-07: 10 mg via ORAL
  Filled 2016-05-06: qty 1

## 2016-05-06 MED ORDER — PHENYLEPHRINE 40 MCG/ML (10ML) SYRINGE FOR IV PUSH (FOR BLOOD PRESSURE SUPPORT)
PREFILLED_SYRINGE | INTRAVENOUS | Status: AC
  Filled 2016-05-06: qty 10

## 2016-05-06 MED ORDER — HYDROMORPHONE HCL 1 MG/ML IJ SOLN
INTRAMUSCULAR | Status: AC
  Filled 2016-05-06: qty 1

## 2016-05-06 MED ORDER — MIDAZOLAM HCL 2 MG/2ML IJ SOLN
INTRAMUSCULAR | Status: AC
  Filled 2016-05-06: qty 2

## 2016-05-06 MED ORDER — ORAL CARE MOUTH RINSE
15.0000 mL | Freq: Two times a day (BID) | OROMUCOSAL | Status: DC
  Administered 2016-05-06 – 2016-05-09 (×6): 15 mL via OROMUCOSAL

## 2016-05-06 MED ORDER — FENTANYL CITRATE (PF) 100 MCG/2ML IJ SOLN
INTRAMUSCULAR | Status: AC
  Filled 2016-05-06: qty 4

## 2016-05-06 MED ORDER — INFLUENZA VAC SPLIT QUAD 0.5 ML IM SUSY: 0.5000 mL | PREFILLED_SYRINGE | INTRAMUSCULAR | Status: DC

## 2016-05-06 MED ORDER — PROTAMINE SULFATE 10 MG/ML IV SOLN
INTRAVENOUS | Status: DC | PRN
  Administered 2016-05-06: 40 mg via INTRAVENOUS

## 2016-05-06 MED ORDER — HYDROMORPHONE HCL 1 MG/ML IJ SOLN: 0.2500 mg | INTRAMUSCULAR | Status: DC | PRN

## 2016-05-06 MED ORDER — SUGAMMADEX SODIUM 200 MG/2ML IV SOLN
INTRAVENOUS | Status: DC | PRN
  Administered 2016-05-06: 150 mg via INTRAVENOUS

## 2016-05-06 MED ORDER — DEXAMETHASONE SODIUM PHOSPHATE 10 MG/ML IJ SOLN
INTRAMUSCULAR | Status: AC
  Filled 2016-05-06: qty 1

## 2016-05-06 MED ORDER — DEXTROSE 5 % IV SOLN
INTRAVENOUS | Status: DC | PRN
  Administered 2016-05-06: 20 ug/min via INTRAVENOUS

## 2016-05-06 MED ORDER — HYDRALAZINE HCL 20 MG/ML IJ SOLN
5.0000 mg | INTRAMUSCULAR | Status: AC | PRN
  Administered 2016-05-06 – 2016-05-07 (×2): 5 mg via INTRAVENOUS
  Filled 2016-05-06 (×2): qty 1

## 2016-05-06 MED ORDER — FENTANYL CITRATE (PF) 100 MCG/2ML IJ SOLN
INTRAMUSCULAR | Status: AC
  Filled 2016-05-06: qty 2

## 2016-05-06 MED ORDER — DEXAMETHASONE SODIUM PHOSPHATE 10 MG/ML IJ SOLN
INTRAMUSCULAR | Status: DC | PRN
  Administered 2016-05-06: 10 mg via INTRAVENOUS

## 2016-05-06 MED ORDER — ONDANSETRON HCL 4 MG/2ML IJ SOLN
4.0000 mg | Freq: Four times a day (QID) | INTRAMUSCULAR | Status: DC | PRN
  Administered 2016-05-07 – 2016-05-08 (×2): 4 mg via INTRAVENOUS
  Filled 2016-05-06 (×2): qty 2

## 2016-05-06 MED ORDER — KCL IN DEXTROSE-NACL 20-5-0.45 MEQ/L-%-% IV SOLN
INTRAVENOUS | Status: DC
  Administered 2016-05-06 – 2016-05-09 (×9): via INTRAVENOUS
  Filled 2016-05-06 (×8): qty 1000

## 2016-05-06 MED ORDER — LACTATED RINGERS IV SOLN
INTRAVENOUS | Status: DC
  Administered 2016-05-06: 07:00:00 via INTRAVENOUS

## 2016-05-06 MED ORDER — HEPARIN SODIUM (PORCINE) 1000 UNIT/ML IJ SOLN
INTRAMUSCULAR | Status: DC | PRN
  Administered 2016-05-06: 6500 [IU] via INTRAVENOUS

## 2016-05-06 MED ORDER — DOCUSATE SODIUM 100 MG PO CAPS
100.0000 mg | ORAL_CAPSULE | Freq: Every day | ORAL | Status: DC
  Administered 2016-05-07 – 2016-05-10 (×3): 100 mg via ORAL
  Filled 2016-05-06 (×4): qty 1

## 2016-05-06 MED ORDER — POTASSIUM CHLORIDE CRYS ER 20 MEQ PO TBCR: 20.0000 meq | EXTENDED_RELEASE_TABLET | Freq: Every day | ORAL | Status: DC | PRN

## 2016-05-06 MED ORDER — PROPOFOL 10 MG/ML IV BOLUS
INTRAVENOUS | Status: AC
  Filled 2016-05-06: qty 20

## 2016-05-06 MED ORDER — HYDROMORPHONE HCL 1 MG/ML IJ SOLN
INTRAMUSCULAR | Status: DC | PRN
  Administered 2016-05-06: 0.5 mg via INTRAVENOUS

## 2016-05-06 MED ORDER — PROTAMINE SULFATE 10 MG/ML IV SOLN
INTRAVENOUS | Status: AC
  Filled 2016-05-06: qty 5

## 2016-05-06 MED ORDER — ACETAMINOPHEN 325 MG RE SUPP
325.0000 mg | RECTAL | Status: DC | PRN
  Filled 2016-05-06: qty 2

## 2016-05-06 MED ORDER — ONDANSETRON HCL 4 MG/2ML IJ SOLN
INTRAMUSCULAR | Status: AC
  Filled 2016-05-06: qty 2

## 2016-05-06 MED ORDER — CHLORHEXIDINE GLUCONATE CLOTH 2 % EX PADS: 6.0000 | MEDICATED_PAD | Freq: Once | CUTANEOUS | Status: DC

## 2016-05-06 MED ORDER — ROCURONIUM BROMIDE 50 MG/5ML IV SOSY
PREFILLED_SYRINGE | INTRAVENOUS | Status: AC
  Filled 2016-05-06: qty 5

## 2016-05-06 MED ORDER — MORPHINE SULFATE (PF) 4 MG/ML IV SOLN
2.0000 mg | INTRAVENOUS | Status: DC | PRN
  Administered 2016-05-06 (×3): 4 mg via INTRAVENOUS
  Administered 2016-05-07: 2 mg via INTRAVENOUS
  Administered 2016-05-07 – 2016-05-09 (×9): 4 mg via INTRAVENOUS
  Filled 2016-05-06 (×13): qty 1

## 2016-05-06 MED ORDER — EPHEDRINE SULFATE-NACL 50-0.9 MG/10ML-% IV SOSY
PREFILLED_SYRINGE | INTRAVENOUS | Status: DC | PRN
  Administered 2016-05-06 (×4): 5 mg via INTRAVENOUS
  Administered 2016-05-06 (×2): 2.5 mg via INTRAVENOUS

## 2016-05-06 MED ORDER — ESMOLOL HCL 100 MG/10ML IV SOLN
INTRAVENOUS | Status: AC
  Filled 2016-05-06: qty 10

## 2016-05-06 MED ORDER — METOPROLOL TARTRATE 5 MG/5ML IV SOLN: 2.0000 mg | INTRAVENOUS | Status: DC | PRN

## 2016-05-06 MED ORDER — ROCURONIUM BROMIDE 50 MG/5ML IV SOSY
PREFILLED_SYRINGE | INTRAVENOUS | Status: AC
  Filled 2016-05-06: qty 10

## 2016-05-06 MED ORDER — SODIUM CHLORIDE 0.9 % IV SOLN: 500.0000 mL | Freq: Once | INTRAVENOUS | Status: DC | PRN

## 2016-05-06 MED ORDER — PANTOPRAZOLE SODIUM 40 MG PO TBEC
40.0000 mg | DELAYED_RELEASE_TABLET | Freq: Every day | ORAL | Status: DC
  Administered 2016-05-06 – 2016-05-07 (×2): 40 mg via ORAL
  Filled 2016-05-06 (×3): qty 1

## 2016-05-06 MED ORDER — BISACODYL 10 MG RE SUPP: 10.0000 mg | Freq: Every day | RECTAL | Status: DC | PRN

## 2016-05-06 MED ORDER — MANNITOL 25 % IV SOLN
INTRAVENOUS | Status: DC | PRN
  Administered 2016-05-06: 12.5 g via INTRAVENOUS

## 2016-05-06 MED ORDER — PROMETHAZINE HCL 25 MG/ML IJ SOLN: 6.2500 mg | INTRAMUSCULAR | Status: DC | PRN

## 2016-05-06 MED ORDER — MORPHINE SULFATE (PF) 2 MG/ML IV SOLN: 2.0000 mg | INTRAVENOUS | Status: DC | PRN

## 2016-05-06 MED ORDER — DEXTROSE 5 % IV SOLN
1.5000 g | INTRAVENOUS | Status: DC
  Filled 2016-05-06: qty 1.5

## 2016-05-06 MED ORDER — LACTATED RINGERS IV SOLN
INTRAVENOUS | Status: DC | PRN
Start: 2016-05-06 — End: 2016-05-06
  Administered 2016-05-06: 07:00:00 via INTRAVENOUS

## 2016-05-06 MED ORDER — PROPOFOL 10 MG/ML IV BOLUS
INTRAVENOUS | Status: DC | PRN
  Administered 2016-05-06: 120 mg via INTRAVENOUS
  Administered 2016-05-06: 20 mg via INTRAVENOUS

## 2016-05-06 MED ORDER — MAGNESIUM SULFATE 2 GM/50ML IV SOLN
2.0000 g | Freq: Every day | INTRAVENOUS | Status: AC | PRN
  Administered 2016-05-07: 2 g via INTRAVENOUS
  Filled 2016-05-06 (×2): qty 50

## 2016-05-06 MED ORDER — ESMOLOL HCL 100 MG/10ML IV SOLN
INTRAVENOUS | Status: DC | PRN
  Administered 2016-05-06: 20 mg via INTRAVENOUS

## 2016-05-06 MED ORDER — ENOXAPARIN SODIUM 40 MG/0.4ML ~~LOC~~ SOLN
40.0000 mg | SUBCUTANEOUS | Status: DC
  Administered 2016-05-07 – 2016-05-10 (×4): 40 mg via SUBCUTANEOUS
  Filled 2016-05-06 (×4): qty 0.4

## 2016-05-06 MED ORDER — LOSARTAN POTASSIUM 50 MG PO TABS
100.0000 mg | ORAL_TABLET | Freq: Every day | ORAL | Status: DC
  Administered 2016-05-07: 100 mg via ORAL
  Filled 2016-05-06: qty 2

## 2016-05-06 MED ORDER — FENTANYL CITRATE (PF) 100 MCG/2ML IJ SOLN
INTRAMUSCULAR | Status: DC | PRN
  Administered 2016-05-06 (×2): 50 ug via INTRAVENOUS
  Administered 2016-05-06 (×2): 25 ug via INTRAVENOUS
  Administered 2016-05-06 (×2): 50 ug via INTRAVENOUS
  Administered 2016-05-06: 150 ug via INTRAVENOUS

## 2016-05-06 MED ORDER — BUPIVACAINE-EPINEPHRINE (PF) 0.5% -1:200000 IJ SOLN: INTRAMUSCULAR | Status: DC | PRN

## 2016-05-06 MED ORDER — SENNOSIDES-DOCUSATE SODIUM 8.6-50 MG PO TABS: 1.0000 | ORAL_TABLET | Freq: Every evening | ORAL | Status: DC | PRN

## 2016-05-06 MED ORDER — PHENYLEPHRINE 40 MCG/ML (10ML) SYRINGE FOR IV PUSH (FOR BLOOD PRESSURE SUPPORT)
PREFILLED_SYRINGE | INTRAVENOUS | Status: DC | PRN
  Administered 2016-05-06 (×2): 80 ug via INTRAVENOUS
  Administered 2016-05-06 (×2): 40 ug via INTRAVENOUS
  Administered 2016-05-06 (×2): 80 ug via INTRAVENOUS
  Administered 2016-05-06: 40 ug via INTRAVENOUS
  Administered 2016-05-06: 120 ug via INTRAVENOUS
  Administered 2016-05-06: 40 ug via INTRAVENOUS
  Administered 2016-05-06: 120 ug via INTRAVENOUS
  Administered 2016-05-06: 80 ug via INTRAVENOUS
  Administered 2016-05-06 (×2): 40 ug via INTRAVENOUS
  Administered 2016-05-06: 20 ug via INTRAVENOUS
  Administered 2016-05-06 (×2): 80 ug via INTRAVENOUS

## 2016-05-06 MED ORDER — LIDOCAINE 2% (20 MG/ML) 5 ML SYRINGE
INTRAMUSCULAR | Status: AC
  Filled 2016-05-06: qty 5

## 2016-05-06 MED ORDER — SODIUM CHLORIDE 0.9 % IV SOLN
INTRAVENOUS | Status: DC | PRN
  Administered 2016-05-06: 500 mL

## 2016-05-06 MED ORDER — DEXTROSE 5 % IV SOLN
1.5000 g | Freq: Two times a day (BID) | INTRAVENOUS | Status: AC
Start: 2016-05-06 — End: 2016-05-07
  Administered 2016-05-06 – 2016-05-07 (×2): 1.5 g via INTRAVENOUS
  Filled 2016-05-06 (×2): qty 1.5

## 2016-05-06 MED ORDER — ONDANSETRON HCL 4 MG/2ML IJ SOLN
INTRAMUSCULAR | Status: DC | PRN
Start: 2016-05-06 — End: 2016-05-06
  Administered 2016-05-06: 4 mg via INTRAVENOUS

## 2016-05-06 MED ORDER — OXYCODONE-ACETAMINOPHEN 5-325 MG PO TABS
1.0000 | ORAL_TABLET | ORAL | Status: DC | PRN
  Administered 2016-05-06 – 2016-05-07 (×3): 2 via ORAL
  Filled 2016-05-06 (×3): qty 2

## 2016-05-06 MED ORDER — EPHEDRINE 5 MG/ML INJ
INTRAVENOUS | Status: AC
  Filled 2016-05-06: qty 10

## 2016-05-06 MED ORDER — ROCURONIUM BROMIDE 10 MG/ML (PF) SYRINGE
PREFILLED_SYRINGE | INTRAVENOUS | Status: DC | PRN
  Administered 2016-05-06 (×2): 50 mg via INTRAVENOUS
  Administered 2016-05-06: 30 mg via INTRAVENOUS

## 2016-05-06 MED ORDER — LABETALOL HCL 5 MG/ML IV SOLN: 10.0000 mg | INTRAVENOUS | Status: DC | PRN

## 2016-05-06 MED ORDER — 0.9 % SODIUM CHLORIDE (POUR BTL) OPTIME
TOPICAL | Status: DC | PRN
  Administered 2016-05-06: 3000 mL

## 2016-05-06 MED ORDER — SUCCINYLCHOLINE CHLORIDE 200 MG/10ML IV SOSY
PREFILLED_SYRINGE | INTRAVENOUS | Status: AC
  Filled 2016-05-06: qty 10

## 2016-05-06 MED ORDER — GUAIFENESIN-DM 100-10 MG/5ML PO SYRP: 15.0000 mL | ORAL_SOLUTION | ORAL | Status: DC | PRN

## 2016-05-06 MED ORDER — MUPIROCIN 2 % EX OINT
1.0000 "application " | TOPICAL_OINTMENT | Freq: Once | CUTANEOUS | Status: AC
  Administered 2016-05-06: 1 via TOPICAL
  Filled 2016-05-06: qty 22

## 2016-05-06 MED ORDER — SODIUM CHLORIDE 0.9 % IV SOLN: Freq: Once | INTRAVENOUS | Status: DC

## 2016-05-06 MED ORDER — PHENOL 1.4 % MT LIQD
1.0000 | OROMUCOSAL | Status: DC | PRN
  Administered 2016-05-08: 1 via OROMUCOSAL
  Filled 2016-05-06: qty 177

## 2016-05-06 MED ORDER — HEMOSTATIC AGENTS (NO CHARGE) OPTIME
TOPICAL | Status: DC | PRN
  Administered 2016-05-06 (×2): 1 via TOPICAL

## 2016-05-06 MED ORDER — PNEUMOCOCCAL VAC POLYVALENT 25 MCG/0.5ML IJ INJ: 0.5000 mL | INJECTION | INTRAMUSCULAR | Status: DC

## 2016-05-06 MED ORDER — ALBUMIN HUMAN 5 % IV SOLN
INTRAVENOUS | Status: DC | PRN
  Administered 2016-05-06 (×5): via INTRAVENOUS

## 2016-05-06 MED ORDER — ASPIRIN 81 MG PO CHEW
81.0000 mg | CHEWABLE_TABLET | Freq: Every day | ORAL | Status: DC
  Administered 2016-05-07: 81 mg via ORAL
  Filled 2016-05-06: qty 1

## 2016-05-06 MED ORDER — MIDAZOLAM HCL 2 MG/2ML IJ SOLN
INTRAMUSCULAR | Status: DC | PRN
  Administered 2016-05-06: 2 mg via INTRAVENOUS

## 2016-05-06 MED ORDER — MEPERIDINE HCL 25 MG/ML IJ SOLN: 6.2500 mg | INTRAMUSCULAR | Status: DC | PRN

## 2016-05-06 MED ORDER — ACETAMINOPHEN 325 MG PO TABS: 325.0000 mg | ORAL_TABLET | ORAL | Status: DC | PRN

## 2016-05-06 MED ORDER — LACTATED RINGERS IV SOLN
INTRAVENOUS | Status: DC | PRN
  Administered 2016-05-06 (×3): via INTRAVENOUS

## 2016-05-06 MED ORDER — SUGAMMADEX SODIUM 200 MG/2ML IV SOLN
INTRAVENOUS | Status: AC
  Filled 2016-05-06: qty 2

## 2016-05-06 MED FILL — Sodium Chloride IV Soln 0.9%: INTRAVENOUS | Qty: 1000 | Status: AC

## 2016-05-06 MED FILL — Heparin Sodium (Porcine) Inj 1000 Unit/ML: INTRAMUSCULAR | Qty: 30 | Status: AC

## 2016-05-06 SURGICAL SUPPLY — 62 items
CANISTER SUCT 3000ML PPV (MISCELLANEOUS) ×2 IMPLANT
CATH COUDE FOLEY 2W 5CC 16FR (CATHETERS) ×2 IMPLANT
CLIP TI MEDIUM 24 (CLIP) ×2 IMPLANT
CLIP TI WIDE RED SMALL 24 (CLIP) ×2 IMPLANT
DERMABOND ADVANCED (GAUZE/BANDAGES/DRESSINGS) ×1
DERMABOND ADVANCED .7 DNX12 (GAUZE/BANDAGES/DRESSINGS) ×1 IMPLANT
DRSG COVADERM 4X14 (GAUZE/BANDAGES/DRESSINGS) ×2 IMPLANT
ELECT BLADE 4.0 EZ CLEAN MEGAD (MISCELLANEOUS) ×2
ELECT BLADE 6.5 EXT (BLADE) ×2 IMPLANT
ELECT CAUTERY BLADE 6.4 (BLADE) ×2 IMPLANT
ELECT REM PT RETURN 9FT ADLT (ELECTROSURGICAL) ×2
ELECTRODE BLDE 4.0 EZ CLN MEGD (MISCELLANEOUS) ×1 IMPLANT
ELECTRODE REM PT RTRN 9FT ADLT (ELECTROSURGICAL) ×1 IMPLANT
FELT TEFLON 4 X1 (Mesh General) ×2 IMPLANT
GAUZE SPONGE 4X4 16PLY XRAY LF (GAUZE/BANDAGES/DRESSINGS) ×2 IMPLANT
GLOVE BIO SURGEON STRL SZ 6.5 (GLOVE) ×4 IMPLANT
GLOVE BIO SURGEON STRL SZ7 (GLOVE) ×6 IMPLANT
GLOVE BIOGEL PI IND STRL 6.5 (GLOVE) ×5 IMPLANT
GLOVE BIOGEL PI IND STRL 7.0 (GLOVE) ×2 IMPLANT
GLOVE BIOGEL PI IND STRL 7.5 (GLOVE) ×3 IMPLANT
GLOVE BIOGEL PI INDICATOR 6.5 (GLOVE) ×5
GLOVE BIOGEL PI INDICATOR 7.0 (GLOVE) ×2
GLOVE BIOGEL PI INDICATOR 7.5 (GLOVE) ×3
GLOVE ECLIPSE 7.0 STRL STRAW (GLOVE) ×4 IMPLANT
GOWN STRL REUS W/ TWL LRG LVL3 (GOWN DISPOSABLE) ×7 IMPLANT
GOWN STRL REUS W/TWL LRG LVL3 (GOWN DISPOSABLE) ×7
GRAFT HEMASHIELD 14X8MM (Vascular Products) ×2 IMPLANT
HEMOSTAT SPONGE AVITENE ULTRA (HEMOSTASIS) ×4 IMPLANT
INSERT FOGARTY 61MM (MISCELLANEOUS) ×4 IMPLANT
INSERT FOGARTY SM (MISCELLANEOUS) ×8 IMPLANT
KIT BASIN OR (CUSTOM PROCEDURE TRAY) ×2 IMPLANT
KIT ROOM TURNOVER OR (KITS) ×2 IMPLANT
LOOP VESSEL MINI RED (MISCELLANEOUS) ×2 IMPLANT
NS IRRIG 1000ML POUR BTL (IV SOLUTION) ×4 IMPLANT
PACK AORTA (CUSTOM PROCEDURE TRAY) ×2 IMPLANT
PAD ARMBOARD 7.5X6 YLW CONV (MISCELLANEOUS) ×4 IMPLANT
PENCIL BUTTON HOLSTER BLD 10FT (ELECTRODE) ×2 IMPLANT
RETAINER VISCERA MED (MISCELLANEOUS) ×2 IMPLANT
STAPLER VISISTAT 35W (STAPLE) ×2 IMPLANT
SUT ETHIBOND 5 LR DA (SUTURE) IMPLANT
SUT MNCRL AB 4-0 PS2 18 (SUTURE) ×4 IMPLANT
SUT PDS AB 1 TP1 96 (SUTURE) ×4 IMPLANT
SUT PROLENE 3 0 SH1 36 (SUTURE) ×10 IMPLANT
SUT PROLENE 5 0 C 1 24 (SUTURE) ×6 IMPLANT
SUT PROLENE 5 0 C 1 36 (SUTURE) ×2 IMPLANT
SUT SILK 2 0 (SUTURE) ×1
SUT SILK 2 0 SH CR/8 (SUTURE) ×2 IMPLANT
SUT SILK 2 0 TIES 17X18 (SUTURE) ×1
SUT SILK 2-0 18XBRD TIE 12 (SUTURE) ×1 IMPLANT
SUT SILK 2-0 18XBRD TIE BLK (SUTURE) ×1 IMPLANT
SUT SILK 3 0 (SUTURE) ×1
SUT SILK 3 0 TIES 17X18 (SUTURE) ×1
SUT SILK 3-0 18XBRD TIE 12 (SUTURE) ×1 IMPLANT
SUT SILK 3-0 18XBRD TIE BLK (SUTURE) ×1 IMPLANT
SUT VIC AB 2-0 CT1 27 (SUTURE) ×2
SUT VIC AB 2-0 CT1 TAPERPNT 27 (SUTURE) ×2 IMPLANT
SUT VIC AB 3-0 SH 27 (SUTURE) ×5
SUT VIC AB 3-0 SH 27X BRD (SUTURE) ×5 IMPLANT
TOWEL BLUE STERILE X RAY DET (MISCELLANEOUS) ×4 IMPLANT
TRAY FOLEY CATH 14FRSI W/METER (CATHETERS) ×2 IMPLANT
TRAY FOLEY W/METER SILVER 16FR (SET/KITS/TRAYS/PACK) ×2 IMPLANT
WATER STERILE IRR 1000ML POUR (IV SOLUTION) ×4 IMPLANT

## 2016-05-06 NOTE — Transfer of Care (Signed)
Immediate Anesthesia Transfer of Care Note  Patient: Corey SkainsMichael W Teicher  Procedure(s) Performed: Procedure(s): AORTO BI- Femoral  BYPASS (Bilateral)  Patient Location: PACU  Anesthesia Type:General  Level of Consciousness: sedated and patient cooperative  Airway & Oxygen Therapy: Patient Spontanous Breathing and Patient connected to face mask oxygen  Post-op Assessment: Report given to RN and Post -op Vital signs reviewed and stable  Post vital signs: Reviewed and stable  Last Vitals:  Vitals:   05/06/16 0640 05/06/16 0701  BP: (!) 145/62 104/71  Pulse:  88  Resp:    Temp:      Last Pain:  Vitals:   05/06/16 0701  TempSrc:   PainSc: 0-No pain      Patients Stated Pain Goal: 3 (05/06/16 0701)  Complications: No apparent anesthesia complications

## 2016-05-06 NOTE — Anesthesia Procedure Notes (Signed)
Central Venous Catheter Insertion Performed by: Nolon Nations, anesthesiologist Patient location: Pre-op. Preanesthetic checklist: patient identified, IV checked, site marked, risks and benefits discussed, surgical consent, monitors and equipment checked, pre-op evaluation, timeout performed and anesthesia consent Position: Trendelenburg Lidocaine 1% used for infiltration and patient sedated Hand hygiene performed , maximum sterile barriers used  and Seldinger technique used Catheter size: 9 Fr Total catheter length 10. Central line was placed.MAC introducer Procedure performed using ultrasound guided technique. Ultrasound Notes:anatomy identified, needle tip was noted to be adjacent to the nerve/plexus identified, no ultrasound evidence of intravascular and/or intraneural injection and image(s) printed for medical record Attempts: 1 Following insertion, line sutured, dressing applied and Biopatch. Post procedure assessment: blood return through all ports, free fluid flow and no air  Patient tolerated the procedure well with no immediate complications.

## 2016-05-06 NOTE — Interval H&P Note (Signed)
History and Physical Interval Note:  05/06/2016 7:54 AM  Gregory Russell  has presented today for surgery, with the diagnosis of Peripheral vascular disease with bilateral lower extremity claudication I70.213  The various methods of treatment have been discussed with the patient and family. After consideration of risks, benefits and other options for treatment, the patient has consented to  Procedure(s): AORTO BI-ILIAC BYPASS (Bilateral) as a surgical intervention .  The patient's history has been reviewed, patient examined, no change in status, stable for surgery.  I have reviewed the patient's chart and labs.  Questions were answered to the patient's satisfaction.     Leonides SakeBrian Buelah Rennie

## 2016-05-06 NOTE — Progress Notes (Signed)
Per CRNA, MD confirmed NGT placement by manipulation in OR. Was at 38cm upon arrival to PACU. Much resistance when attempt to flush. Repositioned to 45.5cm, irrig. Freely and to low int suction-lite tan drainage. KUB done. Narda AmberM. Collins PA fully updated.

## 2016-05-06 NOTE — Op Note (Signed)
OPERATIVE NOTE   PROCEDURE: 1. 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  Extensive calcific atherosclerosis in bilateral external iliac arteries 2.  Faintly palpable pedal pulses in both feet  SPECIMEN(S):  none   INDICATIONS:   Gregory Russell is a 66 y.o. male who presents with lifestyle limiting short distance intermittent claudication.  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  His anatomy was not compatible with an endovascular options, so I offered him aortobi-iliac bypass, possible aortic endartertectomy and possible aortobifemoral bypass.  The patient is aware the risks of aortic surgery include but are not limited to: bleeding, need for transfusion, infection, death, stroke, paralysis, wound complications, bowel injuries, impotence, bowel ischemia, extended ventilation and future ventral hernias.  Overall, I cited a mortality rate of 5-10% and morbidity rate of 30%.   DESCRIPTION: A two surgeon technique was utilized to expedite the completion of this case to decrease his cardiac risk, given the need for bilateral aortofemoral anastomoses.  After obtaining full informed written consent, the patient was brought back to the operating room and placed supine upon the operating table.  The patient received IV antibiotics prior to induction.  A procedure time out was completed and the correct surgical site was verified.  After obtaining adequate anesthesia, the patient was prepped and draped in the standard fashion for: aortobifemoral bypass.  I made a midline incision from xiphoid process to one hand-width  proximal to the pubic bone.  I dissected through the subcutaneous tissue and fascia with electrocautery.  I opened the preperitoneal fat bluntly and then entered into peritoneum under direct visualization.  I opened the rest of the abdomen under direct visualization in the midline.  I placed wet lap pad on th abdominal wall and opened the abdomen with a Balfour retractor.  I briefly ran the intestines and no obvious masses or pathology was evident.    I setup the Omnitract retractor system and the nexsanguinated the transverse colon into a wet towel.  I then exsanguinated the small intestine into a wet towel.  I retracted the small intestine laterally.  I sharply freed the retroperitoneal tissue around the duodenum and continued the dissection into the retroperitoneum with electrocautery, exposing the mid-distal aorta.  I dissected out the aortic bifurcation, in the process coming across a small Inferior mesenteric artery (2 mm).  I dissected out the bilateral common iliac arteries down to the external iliac arteries bilaterally.  Both external iliac arteries had obvious luminal compromise with palpable calcific atherosclerosis that was not compatible with an aortobi-iliac bypass.  This also made an aortic endarterectomy impossible.  I dissected out the mid-segment of this aorta, ligating the Inferior mesenteric artery in this process with a 3-0 silk tie.  I tied off a few lumbar arteries in this process.  I had a 8 cm segment of aorta circumferentially dissected out and placed an umbilical tape.  I irrigated the abdomen with sterile normal saline and placed a wet lap pad.  I turned my attention to the groins.  I marked out the common femoral artery in both groin.  I made an incision over the common femoral artery on both  sides.  I dissected through the subcutaneous tissue on both sides until I had the common femoral artery expose from inguinal ligament to femoral bifurcation bilaterally.  The crossing vein  under the inguinal ligament was ligated with silk bilaterally.  A retroperitoneal tunnel was bluntly started on top bilateral external iliac arteries.  At this, I dissected on top left external iliac artery with a sponge-stick and re-entered in the distal retroperitoneum.  An umbilical tape was then delivered through this retroperitoneal tunnel and clamped.  In a similar fashion, Dr. Edilia Bo dissected a tunnel on top the right external iliac artery, re-entering the distal right retroperitoneum.  An umbilical tape was then delivered through this retroperitoneal tunnel and clamped.    The patient was given 6500 units of Heparin intravenously, which was a therapeutic bolus. 12.5 g of Mannitol was given also.  After waiting three minutes, I clamped the mid-abdominal aorta proximally with a Harken clamp and then clamped the distal aorta with a Debakey aortic clamp.  I transected the aorta 2 cm distal to the proximal clamp.  I then trimmed back another 2 cm of the distal end of the aorta to help the lay of the aortic graft.  I oversewed the distal aorta with a double layer of 3-0 Prolene.    I had previously selected a 14 mmx 8 mm aortobifemoral graft based on sizer measurements.  I did a limited endarterectomy of the proximal aorta due to the presence of ruptured atherosclerotic plaque. This graft was sewn to the proximal aorta with with a running stitch of 3-0 Prolene, taking care to take big bites of the aorta.  Prior to completion, I pulled up on the suture line with a nerve hook.  I clamped the two limb distally and released the proximal clamp.  A few pleats required repairs with 3-0 Prolene.  There was no further active bleeding, so I clamped each limb separately.  I bled each limb separately.  No frank debris or clot was noted.  Dr. Edilia Bo tied the right umbilical tape to the right aortofemoral limb and delivered the limb through the retroperitoneal tunnel.  I did the same thing to the left aortofemoral limb,  delivering the limb through the retroperitoneum.  Dr. Edilia Bo completed the aortofemoral anastomosis to the right common femoral artery (see his notes for details).  I clamped the left common femoral artery proximally and distally and then made an arteriotomy with an 11-blade.  I extended this arteriotomy proximally and distally.  I pulled the left aortofemoral limb to tension and then spatulated the distal end of the limb to meet the dimensions of this arteriotomy, shortening the limb in the process.  I sewed the distal left aortofemoral limb to the left common femoral artery with a running stitch of 5-0 Prolene in an end-to-side configuration.  The graft was position across from the orifice of the profunda femoral artery.  Prior to completion of this anastomosis, I backbled each end of the left common femoral artery: no thrombus was noted.  I also allowed the graft to bleed: no thrombus was noted.  I completed this anastomosis in the usual fashion.   At this point, both groins were packed with Avitene and the patient was given 40 mg of Protamine to reverse anticoagulation.  I turned my attention to the abdomen.  A few bleeding points in the distal retroperitoneum were controlled with electrocautery.  I repaired another pleat in the proximal anastomosis.  I packed the abdomen with Avitene.  I turned my attention to each groin.  The suture line on both anastomoses were pulled up with a nerve hook and some laxity was noted.  I repaired this in the usual fashion.  Both groins were repacked with Avitene.  After waiting a few minutes, no further bleeding was noted.  The doppler signals in the distal femoral arteries was multiphasic at this point.  Each groin incision was repaired with a double layer of 2-0 Vicryl immediately above the graft, a double layer of 3-0 Vicryl and a running subcuticular stitch of 4-0 Monocryl.  The skin was cleaned, dried, and reinforced with Dermabond.  At this point, turned my  attention to the abdomen.  I removed the Avitene and washed out the abdomen.  There was no further bleeding.  I reapproximated the retroperitoneum with a running stitch of 3-0 Vicryl.  I then ran the bowel: no pathology or injuries were noted.  I returned the intestines to their normal anatomic position.  I verified the position of the nasogastric tube.  The fascia was repaired with a running stitch of double strand 1 PDS running from each end and tying in the middle.  I tacked the end of this stitch down to the fascia with a 3-0 Vicryl.  The skin was then reapproximated with staples.  A long Coverall was applied to the stapled incision.  Distally, both feet were warm and pink.  I could feel faintly pedal pulses bilaterally.  This was confirmed with doppler signals.   COMPLICATIONS: none  CONDITION: stable   Leonides SakeBrian Cybill Uriegas, MD, [facility] [place] Office: [phone] Pager: [phone]  05/06/2016, 1:28 PM

## 2016-05-06 NOTE — Progress Notes (Signed)
Lunch relief by M. Brande RN 

## 2016-05-06 NOTE — Anesthesia Procedure Notes (Addendum)
Procedure Name: Intubation Date/Time: 05/06/2016 8:36 AM Performed by: Mervyn Gay Pre-anesthesia Checklist: Patient identified, Emergency Drugs available, Suction available, Patient being monitored and Timeout performed Patient Re-evaluated:Patient Re-evaluated prior to inductionOxygen Delivery Method: Circle system utilized Preoxygenation: Pre-oxygenation with 100% oxygen Intubation Type: IV induction Ventilation: Mask ventilation without difficulty Laryngoscope Size: Mac and 4 Grade View: Grade I Tube type: Oral Tube size: 7.5 mm Number of attempts: 1 Airway Equipment and Method: Stylet Placement Confirmation: ETT inserted through vocal cords under direct vision,  positive ETCO2 and breath sounds checked- equal and bilateral Secured at: 22 cm Tube secured with: Tape Dental Injury: Teeth and Oropharynx as per pre-operative assessment

## 2016-05-06 NOTE — H&P (View-Only) (Signed)
Established Intermittent Claudication  History of Present Illness  Gregory Russell is a 66 y.o. (1950-12-11) male who presents with chief complaint: lifestyle limiting claudication.  The patient's symptoms have progressed.  The patient's symptoms are: severe short distance intermittent claudication which limits his ability to work.  The patiCorey Skainsent's treatment regimen currently included: maximal medical management.  The patient returns from his Cardiologist with a normal nuclear stress testing.  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  Based on imaging, the patient was a candidate for an aorto-bi-iliac vs ABF bypass.    Past Medical History:  Diagnosis Date  . Arthritis   . CHF (congestive heart failure) (HCC)   . Erectile dysfunction   . GERD (gastroesophageal reflux disease)   . Hypertension   . Peripheral vascular disease (HCC)    claudication  . Personal history of kidney stones   . Post herpetic neuralgia   . Primary osteoarthritis of both hips   . Tobacco dependence     Past Surgical History:  Procedure Laterality Date  . NO PAST SURGERIES     as of 03-03-16  . PERIPHERAL VASCULAR CATHETERIZATION N/A 03/13/2016   Procedure: Abdominal Aortogram w/Lower Extremity;  Surgeon: Fransisco HertzBrian L Rumaldo Difatta, MD;  [facility];  Service: Cardiovascular;  Laterality: N/A;    Social History   Social History  . Marital status: Married    Spouse name: N/A  . Number of children: N/A  . Years of education: N/A   Occupational History  . Not on file.   Social History Main Topics  . Smoking status: Current Every Day Smoker    Packs/day: 1.00  . Smokeless tobacco: Never Used  . Alcohol use No  . Drug use: No  . Sexual activity: Not on file   Other Topics Concern  . Not on file   Social History Narrative  . No narrative on file    Family History  Problem Relation Age of Onset  . Alzheimer's  disease Mother   . Heart disease Father     Current Outpatient Prescriptions  Medication Sig Dispense Refill  . amLODipine (NORVASC) 10 MG tablet Take 10 mg by mouth daily.    Marland Kitchen. aspirin (ASPIRIN CHILDRENS) 81 MG chewable tablet Chew 1 tablet (81 mg total) by mouth daily. 90 tablet 3  . losartan (COZAAR) 50 MG tablet Take 100 mg by mouth daily.     Marland Kitchen. omeprazole (PRILOSEC) 40 MG capsule Take 40 mg by mouth daily.    . sildenafil (VIAGRA) 100 MG tablet Take 100 mg by mouth daily as needed for erectile dysfunction.     No current facility-administered medications for this visit.      No Known Allergies   REVIEW OF SYSTEMS:  (Positives checked otherwise negative)  CARDIOVASCULAR:   [ ]  chest pain,  [ ]  chest pressure,  [ ]  palpitations,  [ ]  shortness of breath when laying flat,  [ ]  shortness of breath with exertion,   [x]  pain in feet when walking,  [ ]  pain in feet when laying flat, [ ]  history of blood clot in veins (DVT),  [ ]  history of phlebitis,  [ ]  swelling in legs,  [ ]  varicose veins  PULMONARY:   [ ]  productive cough,  [ ]  asthma,  [ ]  wheezing  NEUROLOGIC:   [ ]  weakness in arms or legs,  [ ]  numbness in arms or legs,  [ ]   difficulty speaking or slurred speech,  [ ]  temporary loss of vision in one eye,  [ ]  dizziness  HEMATOLOGIC:   [ ]  bleeding problems,  [ ]  problems with blood clotting too easily  MUSCULOSKEL:   [ ]  joint pain, [ ]  joint swelling  GASTROINTEST:   [ ]  vomiting blood,  [ ]  blood in stool     GENITOURINARY:   [ ]  burning with urination,  [ ]  blood in urine  PSYCHIATRIC:   [ ]  history of major depression  INTEGUMENTARY:   [ ]  rashes,  [ ]  ulcers  CONSTITUTIONAL:   [ ]  fever,  [ ]  chills   Physical Examination  Vitals:   05/02/16 1318  BP: 114/75  Pulse: 83  Resp: 16  Temp: 97.8 F (36.6 C)  TempSrc: Oral  SpO2: 94%  Weight: 137 lb (62.1 kg)  Height: 5\' 11"  (1.803 m)    Body mass index is 19.11  kg/m.  General: Alert, O x 3, WD,NAD  Pulmonary: Sym exp, good B air movt,CTA B  Cardiac: RRR, Nl S1, S2, no Murmurs, No rubs, No S3,S4  Vascular: Vessel Right Left  Radial Palpable Palpable  Brachial Palpable Palpable  Carotid Palpable, No Bruit Palpable, No Bruit  Aorta Not palpable N/A  Femoral Faintly palpable Faintly palpable  Popliteal Not palpable Not palpable  PT Not palpable Not palpable  DP Not palpable Not palpable   Gastrointestinal: soft, non-distended, non-tender to palpation, No guarding or rebound, no HSM, no masses, no CVAT B, No palpable prominent aortic pulse,    Musculoskeletal: M/S 5/5 throughout  , Extremities without ischemic changes  , No edema present,  , No LDS present  Neurologic: CN 2-12 intact , Pain and light touch intact in extremities , Motor exam as listed above   Non-Invasive Vascular Imaging Outside ABI R: 0.81 L: 0.71   Medical Decision Making  Gregory Russell is a 66 y.o. 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   Given this patient's anatomy, when his distal aorta occludes, he will likely have bilateral threatened limb.  To avoid this potentially fatal outcome, I have offered the patient: aortobi-iliac bypass, possible distal aortic endarterectomy, possible aortobifemoral bypass.  The patient is aware that his anatomy will determine the procedure completed intraoperatively. The patient is aware the risks of aortic surgery include but are not limited to: bleeding, need for transfusion, infection, death, stroke, paralysis, wound complications, bowel injuries, impotence, bowel ischemia, extended ventilation and future ventral hernias.   Overall, I cited a mortality rate of 5-10% and morbidity rate of 30%..  I discussed in depth with the patient the nature of  atherosclerosis, and emphasized the importance of maximal medical management including strict control of blood pressure, blood glucose, and lipid levels, antiplatelet agents, obtaining regular exercise, and cessation of smoking.    The patient is aware that without maximal medical management the underlying atherosclerotic disease process will progress, limiting the benefit of any interventions. The patient is currently not on a statin. Patient will be started on Lipitor 10 mg PO daily, to be titrated and managed by their primary physician.  The patient is currently on an anti-platelet: ASA.  Smoking cessation was emphasized to this patient.  The patient is aware he is at higher risk of mortality, morbidities, and decreased patency with continued smoking.  Thank you for allowing  [place] to participate in this patient's care.   Leonides Sake, MD, FACS [facility] Office: [phone] Pager: [phone]

## 2016-05-06 NOTE — Anesthesia Procedure Notes (Deleted)
Anesthesia Regional Block: Interscalene brachial plexus block   Pre-Anesthetic Checklist: ,, timeout performed, Correct Patient, Correct Site, Correct Laterality, Correct Procedure, Correct Position, site marked, Risks and benefits discussed,  Surgical consent,  Pre-op evaluation,  At surgeon's request and post-op pain management  Laterality: Right  Prep: chloraprep       Needles:  Injection technique: Single-shot  Needle Type: Stimulator Needle - 40     Needle Length: 4cm  Needle Gauge: 22     Additional Needles:   Procedures: ultrasound guided,,,,,,,,  Narrative:  Start time: 05/06/2016 8:00 AM End time: 05/06/2016 8:05 AM Injection made incrementally with aspirations every 5 mL. Anesthesiologist: Lewie LoronGERMEROTH, Avyonna Wagoner  Additional Notes: BP cuff, EKG monitors applied. Sedation begun. Nerve location verified with U/S. Anesthetic injected incrementally, slowly , and after neg aspirations under direct u/s guidance. Good perineural spread. Tolerated well.

## 2016-05-07 ENCOUNTER — Inpatient Hospital Stay (HOSPITAL_COMMUNITY): Payer: Medicare Other

## 2016-05-07 LAB — POCT I-STAT 7, (LYTES, BLD GAS, ICA,H+H)
Acid-base deficit: 1 mmol/L (ref 0.0–2.0)
Bicarbonate: 24.2 mmol/L (ref 20.0–28.0)
Bicarbonate: 25.7 mmol/L (ref 20.0–28.0)
CALCIUM ION: 1.19 mmol/L (ref 1.15–1.40)
Calcium, Ion: 1.15 mmol/L (ref 1.15–1.40)
HCT: 29 % — ABNORMAL LOW (ref 39.0–52.0)
HEMATOCRIT: 37 % — AB (ref 39.0–52.0)
Hemoglobin: 12.6 g/dL — ABNORMAL LOW (ref 13.0–17.0)
Hemoglobin: 9.9 g/dL — ABNORMAL LOW (ref 13.0–17.0)
O2 SAT: 99 %
O2 SAT: 99 %
PCO2 ART: 47 mmHg (ref 32.0–48.0)
PO2 ART: 172 mmHg — AB (ref 83.0–108.0)
POTASSIUM: 3.7 mmol/L (ref 3.5–5.1)
Potassium: 4.6 mmol/L (ref 3.5–5.1)
SODIUM: 141 mmol/L (ref 135–145)
Sodium: 139 mmol/L (ref 135–145)
TCO2: 26 mmol/L (ref 0–100)
TCO2: 27 mmol/L (ref 0–100)
pCO2 arterial: 43.7 mmHg (ref 32.0–48.0)
pH, Arterial: 7.352 (ref 7.350–7.450)
pH, Arterial: 7.347 — ABNORMAL LOW (ref 7.350–7.450)
pO2, Arterial: 167 mmHg — ABNORMAL HIGH (ref 83.0–108.0)

## 2016-05-07 LAB — COMPREHENSIVE METABOLIC PANEL
ALT: 23 U/L (ref 17–63)
AST: 23 U/L (ref 15–41)
Albumin: 3.8 g/dL (ref 3.5–5.0)
Alkaline Phosphatase: 48 U/L (ref 38–126)
Anion gap: 9 (ref 5–15)
BUN: 13 mg/dL (ref 6–20)
CHLORIDE: 104 mmol/L (ref 101–111)
CO2: 26 mmol/L (ref 22–32)
Calcium: 9 mg/dL (ref 8.9–10.3)
Creatinine, Ser: 0.93 mg/dL (ref 0.61–1.24)
GFR calc Af Amer: >60 mL/min (ref >60)
Glucose, Bld: 173 mg/dL — ABNORMAL HIGH (ref 65–99)
POTASSIUM: 4.3 mmol/L (ref 3.5–5.1)
SODIUM: 139 mmol/L (ref 135–145)
Total Bilirubin: 0.5 mg/dL (ref 0.3–1.2)
Total Protein: 5.4 g/dL — ABNORMAL LOW (ref 6.5–8.1)

## 2016-05-07 LAB — CBC
HCT: 34 % — ABNORMAL LOW (ref 39.0–52.0)
Hemoglobin: 11.5 g/dL — ABNORMAL LOW (ref 13.0–17.0)
MCH: 30.7 pg (ref 26.0–34.0)
MCHC: 33.8 g/dL (ref 30.0–36.0)
MCV: 90.9 fL (ref 78.0–100.0)
PLATELETS: 194 10*3/uL (ref 150–400)
RBC: 3.74 MIL/uL — ABNORMAL LOW (ref 4.22–5.81)
RDW: 14.4 % (ref 11.5–15.5)
WBC: 15.6 10*3/uL — AB (ref 4.0–10.5)

## 2016-05-07 LAB — MAGNESIUM: MAGNESIUM: 1.5 mg/dL — AB (ref 1.7–2.4)

## 2016-05-07 LAB — AMYLASE: AMYLASE: 58 U/L (ref 28–100)

## 2016-05-07 LAB — POCT ACTIVATED CLOTTING TIME: Activated Clotting Time: 0 seconds

## 2016-05-07 MED ORDER — METOPROLOL TARTRATE 5 MG/5ML IV SOLN
5.0000 mg | Freq: Four times a day (QID) | INTRAVENOUS | Status: DC
  Administered 2016-05-07 – 2016-05-09 (×7): 5 mg via INTRAVENOUS
  Filled 2016-05-07 (×7): qty 5

## 2016-05-07 NOTE — Care Management Note (Addendum)
Case Management Note  Patient Details  Name: Corey SkainsMichael W Gramling MRN: [id number] Date of Birth: 1950-07-14  1. Subjective/Objective:                 Patient from home with wife. 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 Spoke with patient and wife and daughter at the bedside. Patient worked with PT today with NGT, [place], [facility] and was rec HH PT and RW. Patient and family (consisting of nurses) would like to work with PT again, less tubes, [facility] as [facility] was fully independent prior to admission.   3/39 Letha Capeeborah Janiyla Long RN, BSN- spoke with patient again, he states he wants to [facility].  He did not want to set up [facility] services yet.  Informed him NCM will cont to follow his progression.    Action/Plan:  CM will continue to follow.  Expected Discharge Date:                  Expected Discharge Plan:     In-House Referral:     Discharge planning Services  CM Consult  Post Acute Care Choice:    Choice offered to:     DME Arranged:    DME Agency:     HH Arranged:    HH Agency:     Status of Service:  In process, will continue to follow  If discussed at Long Length of Stay Meetings, dates discussed:    Additional Comments:  Lawerance SabalDebbie Swist, RN 05/07/2016, 9:37 AM

## 2016-05-07 NOTE — Evaluation (Signed)
Physical Therapy Evaluation Patient Details Name: Gregory Russell MRN: [id number] DOB: 1950-06-10 Today's Date: 05/07/2016   History of Present Illness  Pt is 66 yo male who presents for aorto bi-iliac bypass due to functionally limiting claudication. Other PMH: CHF, OA B hips, PVD, post herpetic neuralgia, GERD  Clinical Impression  Pt admitted with above diagnosis. Pt currently with functional limitations due to the deficits listed below (see PT Problem List). Pt ambulated 24' with RW and min-guard A. Desat on RA, so kept on 3L O2 and sats remained 93-94%.  Pt will benefit from skilled PT to increase their independence and safety with mobility to allow discharge to the venue listed below.       Follow Up Recommendations Home health PT    Equipment Recommendations  Rolling walker with 5" wheels    Recommendations for Other Services       Precautions / Restrictions Precautions Precautions: Fall Restrictions Weight Bearing Restrictions: No      Mobility  Bed Mobility Overal bed mobility: Needs Assistance Bed Mobility: Sit to Supine       Sit to supine: Min assist   General bed mobility comments: min A to LE's for mgmt when back to bed  Transfers Overall transfer level: Needs assistance Equipment used: Rolling walker (2 wheeled) Transfers: Sit to/from Stand Sit to Stand: Supervision         General transfer comment: pt stood safely from recliner  Ambulation/Gait Ambulation/Gait assistance: Min guard Ambulation Distance (Feet): 70 Feet Assistive device: Rolling walker (2 wheeled) Gait Pattern/deviations: Step-through pattern;Decreased stride length;Trunk flexed Gait velocity: decreased Gait velocity interpretation: <1.8 ft/sec, indicative of risk for recurrent falls General Gait Details: pt felt he could not stand stright due to abdominal pain, RW helped with pain mgmt  Stairs            Wheelchair Mobility    Modified Rankin (Stroke Patients Only)        Balance Overall balance assessment: Needs assistance Sitting-balance support: No upper extremity supported Sitting balance-Leahy Scale: Good     Standing balance support: Single extremity supported Standing balance-Leahy Scale: Fair                               Pertinent Vitals/Pain Pain Assessment: 0-10 Pain Score: 8  Pain Location: abdomen Pain Descriptors / Indicators: Aching Pain Intervention(s): Limited activity within patient's tolerance;Monitored during session;Premedicated before session    Home Living Family/patient expects to be discharged to:: Private residence Living Arrangements: Spouse/significant other Available Help at Discharge: Family;Available PRN/intermittently Type of Home: House Home Access: Stairs to enter Entrance Stairs-Rails: Right Entrance Stairs-Number of Steps: 4 Home Layout: One level Home Equipment: Cane - single point Additional Comments: pt retired but still works part time, "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 Wife works in the day as well    Prior Function Level of Independence: Independent         Comments: very active     Hand Dominance   Dominant Hand: Right    Extremity/Trunk Assessment   Upper Extremity Assessment Upper Extremity Assessment: Defer to OT evaluation    Lower Extremity Assessment Lower Extremity Assessment: Generalized weakness    Cervical / Trunk Assessment Cervical / Trunk Assessment: Other exceptions Cervical / Trunk Exceptions: trunk flexed due to abdominal and groin pain  Communication   Communication: No difficulties  Cognition Arousal/Alertness: Awake/alert Behavior During Therapy: WFL for tasks assessed/performed Overall Cognitive Status:  Within Functional Limits for tasks assessed                                        General Comments General comments (skin integrity, edema, etc.): Pt on 3L O2, 94 % O2 sat. Dropped to 89% on RA. Kept on 3L to  ambulate and remained 93-94%.    Exercises     Assessment/Plan    PT Assessment Patient needs continued PT services  PT Problem List Decreased strength;Decreased activity tolerance;Decreased balance;Decreased mobility;Decreased knowledge of use of DME;Decreased knowledge of precautions;Pain;Cardiopulmonary status limiting activity       PT Treatment Interventions DME instruction;Gait training;Stair training;Functional mobility training;Therapeutic activities;Therapeutic exercise;Balance training;Patient/family education    PT Goals (Current goals can be found in the Care Plan section)  Acute Rehab PT Goals Patient Stated Goal: return home PT Goal Formulation: With patient Time For Goal Achievement: 05/21/16 Potential to Achieve Goals: Good    Frequency Min 3X/week   Barriers to discharge        Co-evaluation               End of Session Equipment Utilized During Treatment: Gait belt;Oxygen Activity Tolerance: Patient tolerated treatment well Patient left: in bed;with call bell/phone within reach;with family/visitor present Nurse Communication: Mobility status PT Visit Diagnosis: Unsteadiness on feet (R26.81);Muscle weakness (generalized) (M62.81)    Time: [id number] PT Time Calculation (min) (ACUTE ONLY): 37 min   Charges:   PT Evaluation $PT Eval Moderate Complexity: 1 Procedure PT Treatments $Gait Training: 8-22 mins   PT G Codes:        Lyanne CoVictoria Paislee Szatkowski, PT  Acute Rehab Services  [phone]   The LakesVictoria L Shenita Trego 05/07/2016, 2:13 PM

## 2016-05-07 NOTE — Anesthesia Postprocedure Evaluation (Signed)
Anesthesia Post Note  Patient: Gregory Russell  Procedure(s) Performed: Procedure(s) (LRB): AORTO BI- Femoral  BYPASS (Bilateral)  Patient location during evaluation: PACU Anesthesia Type: General Level of consciousness: sedated and patient cooperative Pain management: pain level controlled Vital Signs Assessment: post-procedure vital signs reviewed and stable Respiratory status: spontaneous breathing Cardiovascular status: stable Anesthetic complications: no       Last Vitals:  Vitals:   05/07/16 0400 05/07/16 0500  BP: 120/60 111/63  Pulse: 74 78  Resp: 13 10  Temp:      Last Pain:  Vitals:   05/07/16 0000  TempSrc:   PainSc: Asleep                 [name]

## 2016-05-07 NOTE — Progress Notes (Addendum)
   Daily Progress Note   Assessment/Planning: POD #1 s/p ABF   Ok to D/C A-line, cordis, and foley  Keep NGT.  Awaiting return of bowel function  ABI pending  Ok to go to stepdown ICU   Subjective  - 1 Day Post-Op  Pain ok.  Feet feel a little better.   Objective Vitals:   05/07/16 0747 05/07/16 0800 05/07/16 0900 05/07/16 1000  BP:  128/61 133/60 119/61  Pulse:  71 85 80  Resp:  10 14 14   Temp: 97.3 F (36.3 C)     TempSrc: Oral     SpO2:  94% 94% 96%  Weight:      Height:         Intake/Output Summary (Last 24 hours) at 05/07/16 1053 Last data filed at 05/07/16 1000  Gross per 24 hour  Intake          4559.17 ml  Output             3405 ml  Net          1154.17 ml    PULM  BLL rales CV  RRR GI  soft, appropriate tender, bandage in place, -G/R, +hypo BS VASC  B feet pink with faintly palpable pulses (PT>DP)  Laboratory CBC    Component Value Date/Time   WBC 15.6 (H) 05/07/2016 0340   HGB 11.5 (L) 05/07/2016 0340   HCT 34.0 (L) 05/07/2016 0340   PLT 194 05/07/2016 0340    BMET    Component Value Date/Time   NA 139 05/07/2016 0340   K 4.3 05/07/2016 0340   CL 104 05/07/2016 0340   CO2 26 05/07/2016 0340   GLUCOSE 173 (H) 05/07/2016 0340   BUN 13 05/07/2016 0340   CREATININE 0.93 05/07/2016 0340   CALCIUM 9.0 05/07/2016 0340   GFRNONAA >60 05/07/2016 0340   GFRAA >60 05/07/2016 0340     Gregory SakeBrian Shadiamond Koska, MD, FACS [facility] [place] Office: [phone] Pager: [phone]  05/07/2016, 10:53 AM

## 2016-05-07 NOTE — Evaluation (Signed)
Occupational Therapy Evaluation Patient Details Name: Gregory Russell MRN: [id number] DOB: 03-10-1950 Today's Date: 05/07/2016    History of Present Illness Pt is 66 yo male who presents for aorto bi-iliac bypass due to functionally limiting claudication. Other PMH: CHF, OA B hips, PVD, post herpetic neuralgia, GERD   Clinical Impression   Pt was independent prior to admission. Currently, pt requires min to min guard assist for mobility and min to max assist for ADL. Pt with abdominal pain interfering with ability to access LEs for bathing and dressing. Pt will have assist of his wife as needed at home. Will follow acutely.    Follow Up Recommendations  No OT follow up    Equipment Recommendations  None recommended by OT    Recommendations for Other Services       Precautions / Restrictions Precautions Precautions: Fall Restrictions Weight Bearing Restrictions: No Other Position/Activity Restrictions: NGT      Mobility Bed Mobility Overal bed mobility: Needs Assistance Bed Mobility: Rolling;Sidelying to Sit;Sit to Sidelying Rolling: Min assist Sidelying to sit: Min assist   Sit to supine: Min assist Sit to sidelying: Min assist General bed mobility comments: educated in log roll technique to minimize abdominal pain  Transfers Overall transfer level: Needs assistance Equipment used: Rolling walker (2 wheeled) Transfers: Sit to/from Stand Sit to Stand: Supervision         General transfer comment: from bed    Balance Overall balance assessment: Needs assistance Sitting-balance support: No upper extremity supported Sitting balance-Leahy Scale: Good     Standing balance support: Single extremity supported Standing balance-Leahy Scale: Fair                             ADL either performed or assessed with clinical judgement   ADL Overall ADL's : Needs assistance/impaired Eating/Feeding: NPO   Grooming: Wash/dry hands;Sitting;Set up   Upper  Body Bathing: Sitting;Minimal assistance   Lower Body Bathing: Maximal assistance;Sit to/from stand   Upper Body Dressing : Minimal assistance;Sitting   Lower Body Dressing: Sit to/from stand;Moderate assistance   Toilet Transfer: Stand-pivot;BSC;RW;Min guard   Toileting- Clothing Manipulation and Hygiene: Minimal assistance;Sit to/from Nurse, children's Details (indicate cue type and reason): educated in use of plastic lawn chair as shower seat Functional mobility during ADLs: Rolling walker;Min guard General ADL Comments: Ablility to access feet for ADL limited by pain.     Vision Patient Visual Report: No change from baseline       Perception     Praxis      Pertinent Vitals/Pain Pain Assessment: Faces Pain Score: 8  Faces Pain Scale: Hurts even more Pain Location: abdomen Pain Descriptors / Indicators: Aching;Grimacing;Guarding Pain Intervention(s): Monitored during session;Premedicated before session;Repositioned     Hand Dominance Right   Extremity/Trunk Assessment Upper Extremity Assessment Upper Extremity Assessment: Overall WFL for tasks assessed   Lower Extremity Assessment Lower Extremity Assessment: Defer to PT evaluation   Cervical / Trunk Assessment Cervical / Trunk Assessment: Other exceptions Cervical / Trunk Exceptions: trunk flexed due to abdominal and groin pain   Communication Communication Communication: No difficulties   Cognition Arousal/Alertness: Awake/alert Behavior During Therapy: WFL for tasks assessed/performed Overall Cognitive Status: Within Functional Limits for tasks assessed  General Comments  Pt on 3L O2, 94 % O2 sat. Dropped to 89% on RA. Kept on 3L to ambulate and remained 93-94%.    Exercises     Shoulder Instructions      Home Living Family/patient expects to be discharged to:: Private residence Living Arrangements: Spouse/significant other Available  Help at Discharge: Family;Available PRN/intermittently Type of Home: House Home Access: Stairs to enter Entergy CorporationEntrance Stairs-Number of Steps: 4 Entrance Stairs-Rails: Right Home Layout: One level     Bathroom Shower/Tub: Producer, television/film/videoWalk-in shower   Bathroom Toilet: Handicapped height     Home Equipment: Gilmer MorCane - single point   Additional Comments: pt retired but still works part time, "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 Wife works in the day as well      Prior Functioning/Environment Level of Independence: Independent        Comments: very active        OT Problem List: Decreased strength;Impaired balance (sitting and/or standing);Decreased activity tolerance;Pain;Decreased knowledge of use of DME or AE      OT Treatment/Interventions: Self-care/ADL training;DME and/or AE instruction;Balance training;Patient/family education    OT Goals(Current goals can be found in the care plan section) Acute Rehab OT Goals Patient Stated Goal: return home OT Goal Formulation: With patient Time For Goal Achievement: 05/21/16 Potential to Achieve Goals: Good ADL Goals Pt Will Perform Grooming: with supervision;standing Pt Will Perform Lower Body Bathing: with supervision;sit to/from stand;with caregiver independent in assisting Pt Will Perform Lower Body Dressing: with supervision;sit to/from stand;with caregiver independent in assisting Pt Will Transfer to Toilet: with supervision;ambulating (comfort height toilet) Pt Will Perform Toileting - Clothing Manipulation and hygiene: with supervision;sit to/from stand Pt Will Perform Tub/Shower Transfer: Shower transfer;ambulating;with supervision;with caregiver independent in assisting  OT Frequency: Min 2X/week   Barriers to D/C:            Co-evaluation              End of Session Equipment Utilized During Treatment: Gait belt;Rolling walker  Activity Tolerance: Patient tolerated treatment well Patient left: in bed;with call bell/phone  within reach  OT Visit Diagnosis: Unsteadiness on feet (R26.81);Pain Pain - part of body:  (abdomen, groin)                Time: 6045-40981348-1405 OT Time Calculation (min): 17 min Charges:  OT General Charges $OT Visit: 1 Procedure OT Evaluation $OT Eval Moderate Complexity: 1 Procedure G-Codes:     Evern BioMayberry, Gerianne Simonet Lynn 05/07/2016, 3:27 PM  [phone]

## 2016-05-08 ENCOUNTER — Inpatient Hospital Stay (HOSPITAL_COMMUNITY): Payer: Medicare Other

## 2016-05-08 DIAGNOSIS — I771 Stricture of artery: Secondary | ICD-10-CM

## 2016-05-08 LAB — MAGNESIUM: MAGNESIUM: 2.1 mg/dL (ref 1.7–2.4)

## 2016-05-08 MED ORDER — PANTOPRAZOLE SODIUM 40 MG IV SOLR
40.0000 mg | INTRAVENOUS | Status: DC
  Administered 2016-05-08: 40 mg via INTRAVENOUS
  Filled 2016-05-08 (×2): qty 40

## 2016-05-08 NOTE — Progress Notes (Signed)
   Daily Progress Note   Assessment/Planning: POD #2 s/p ABF   Awaiting return of BF (1.3 L NGT output)  ABI  IS x 10 q1 hr  Ok to go to floor  Subjective  - 2 Days Post-Op  Pain controlled, -BM/-F  Objective Vitals:   05/07/16 2325 05/08/16 0000 05/08/16 0041 05/08/16 0500  BP: 117/61 (!) 111/56 137/71 115/64  Pulse: 90 84 85 93  Resp: 20 18 15 17   Temp: 99.6 F (37.6 C)  98.2 F (36.8 C) 98.8 F (37.1 C)  TempSrc: Oral  Oral Oral  SpO2: 91% 90% 93% 94%  Weight:      Height:         Intake/Output Summary (Last 24 hours) at 05/08/16 [id number] Last data filed at 05/08/16 0541  Gross per 24 hour  Intake           2647.5 ml  Output             3150 ml  Net           -502.5 ml    PULM  BLL rales CV  RRR GI  soft, appropriate TTP, -G/R, hypoBS, NGT with bilious drainage VASC  R PT > L palpable, faintly palpable DP  Laboratory CBC    Component Value Date/Time   WBC 15.6 (H) 05/07/2016 0340   HGB 11.5 (L) 05/07/2016 0340   HCT 34.0 (L) 05/07/2016 0340   PLT 194 05/07/2016 0340    BMET    Component Value Date/Time   NA 139 05/07/2016 0340   K 4.3 05/07/2016 0340   CL 104 05/07/2016 0340   CO2 26 05/07/2016 0340   GLUCOSE 173 (H) 05/07/2016 0340   BUN 13 05/07/2016 0340   CREATININE 0.93 05/07/2016 0340   CALCIUM 9.0 05/07/2016 0340   GFRNONAA >60 05/07/2016 0340   GFRAA >60 05/07/2016 0340     Leonides SakeBrian Ginelle Bays, MD, FACS [facility] [place] Office: [phone] Pager: [phone]  05/08/2016, 7:12 AM

## 2016-05-08 NOTE — Progress Notes (Signed)
qPhysical Therapy Treatment Patient Details Name: Gregory Russell MRN: [id number] DOB: 11-02-1950 Today's Date: 05/08/2016    History of Present Illness Pt is 66 yo male who presents for aorto bi-iliac bypass due to functionally limiting claudication. Other PMH: CHF, OA B hips, PVD, post herpetic neuralgia, GERD    PT Comments    Pt continues with good progress toward mobility goals. He became nauseated upon return to room from ambulation. He, therefore, elected to return to bed instead of sitting in chair. Pt/wife is hopeful he will progress to not needing HHPT at d/c.    Follow Up Recommendations  Home health PT     Equipment Recommendations  Rolling walker with 5" wheels    Recommendations for Other Services       Precautions / Restrictions Precautions Precautions: Fall Restrictions Weight Bearing Restrictions: No Other Position/Activity Restrictions: NGT    Mobility  Bed Mobility     Rolling: Min assist Sidelying to sit: Min assist;HOB elevated   Sit to supine: Min assist;HOB elevated   General bed mobility comments: verbal cues for sequencing, +rail  Transfers   Equipment used: Rolling walker (2 wheeled)   Sit to Stand: Min guard         General transfer comment: verbal cues for hand placement  Ambulation/Gait Ambulation/Gait assistance: Min guard Ambulation Distance (Feet): 85 Feet Assistive device: Rolling walker (2 wheeled) Gait Pattern/deviations: Step-through pattern;Decreased stride length;Trunk flexed Gait velocity: decreased Gait velocity interpretation: Below normal speed for age/gender General Gait Details: Pt ambulated on 2 L O2 with sats ranging 91-93%. mild LOB with turning. Min guard assist to correct.   Stairs            Wheelchair Mobility    Modified Rankin (Stroke Patients Only)       Balance   Sitting-balance support: No upper extremity supported;Feet supported Sitting balance-Leahy Scale: Good     Standing  balance support: During functional activity;Bilateral upper extremity supported Standing balance-Leahy Scale: Fair                              Cognition Arousal/Alertness: Awake/alert Behavior During Therapy: WFL for tasks assessed/performed Overall Cognitive Status: Within Functional Limits for tasks assessed                                        Exercises      General Comments        Pertinent Vitals/Pain Pain Assessment: 0-10 Pain Score: 3  Pain Location: abdomen Pain Descriptors / Indicators: Aching;Grimacing Pain Intervention(s): Premedicated before session;Monitored during session    Home Living                      Prior Function            PT Goals (current goals can now be found in the care plan section) Acute Rehab PT Goals Patient Stated Goal: return home PT Goal Formulation: With patient Time For Goal Achievement: 05/21/16 Potential to Achieve Goals: Good Progress towards PT goals: Progressing toward goals    Frequency    Min 3X/week      PT Plan Current plan remains appropriate    Co-evaluation             End of Session Equipment Utilized During Treatment: Gait belt;Oxygen Activity Tolerance: Patient tolerated treatment well  Patient left: in bed;with call bell/phone within reach;with family/visitor present Nurse Communication: Mobility status;Other (comment) (nausea) PT Visit Diagnosis: Unsteadiness on feet (R26.81);Muscle weakness (generalized) (M62.81)     Time: 9604-5409 PT Time Calculation (min) (ACUTE ONLY): 33 min  Charges:  $Gait Training: 23-37 mins                    G Codes:       Aida Raider, PT  Office # [phone] Pager [phone]    Ilda Foil 05/08/2016, 10:19 AM

## 2016-05-08 NOTE — Plan of Care (Signed)
Problem: Pain Managment: Goal: General experience of comfort will improve Outcome: Progressing Receiving prn morphine as ordered.   Problem: Activity: Goal: Risk for activity intolerance will decrease Outcome: Progressing Ambulated in hall with PT today.   Problem: Fluid Volume: Goal: Ability to maintain a balanced intake and output will improve Outcome: Progressing Receiving IVF and voiding appropriately.   Problem: Nutrition: Goal: Adequate nutrition will be maintained Outcome: Not Progressing Remaining npo while NGT in place.   Problem: Bowel/Gastric: Goal: Will not experience complications related to bowel motility Outcome: Progressing Active bowel sounds, but not currently passing gas.

## 2016-05-08 NOTE — Progress Notes (Signed)
VASCULAR LAB PRELIMINARY  ARTERIAL  ABI completed: Right ABI of 1.14 and left ABI of 1.08 are suggestive of arterial flow within normal limits at rest. Previous ABI's obtained on 01/28/16 demonstrate an ABI of 0.81 on the right and 0.71 on the left.   RIGHT    LEFT    PRESSURE WAVEFORM  PRESSURE WAVEFORM  BRACHIAL 123 Triphasic BRACHIAL 120 Triphasic  DP 125 Biphasic DP 115 Biphasic  PT 140 Biphasic PT 133 Biphasic    RIGHT LEFT  ABI 1.14 1.08     Gregory StainGregory J Matayah Russell, RVT 05/08/2016, 8:25 AM

## 2016-05-09 LAB — BPAM RBC
BLOOD PRODUCT EXPIRATION DATE: 201804062359
BLOOD PRODUCT EXPIRATION DATE: 201804202359
BLOOD PRODUCT EXPIRATION DATE: 201804212359
Blood Product Expiration Date: 201804072359
ISSUE DATE / TIME: 201803270839
ISSUE DATE / TIME: 201803270839
UNIT TYPE AND RH: 5100
UNIT TYPE AND RH: 5100
Unit Type and Rh: 5100
Unit Type and Rh: 5100

## 2016-05-09 LAB — TYPE AND SCREEN
ABO/RH(D): O POS
ANTIBODY SCREEN: NEGATIVE
UNIT DIVISION: 0
Unit division: 0
Unit division: 0
Unit division: 0

## 2016-05-09 MED ORDER — PANTOPRAZOLE SODIUM 40 MG PO TBEC
40.0000 mg | DELAYED_RELEASE_TABLET | Freq: Every day | ORAL | Status: DC
  Administered 2016-05-09 – 2016-05-10 (×2): 40 mg via ORAL
  Filled 2016-05-09 (×2): qty 1

## 2016-05-09 MED ORDER — OXYCODONE-ACETAMINOPHEN 5-325 MG PO TABS: 1.0000 | ORAL_TABLET | Freq: Four times a day (QID) | ORAL | 0 refills | Status: DC | PRN

## 2016-05-09 MED ORDER — OXYCODONE-ACETAMINOPHEN 5-325 MG PO TABS
1.0000 | ORAL_TABLET | Freq: Four times a day (QID) | ORAL | Status: DC | PRN
  Administered 2016-05-09 – 2016-05-11 (×6): 2 via ORAL
  Filled 2016-05-09 (×6): qty 2

## 2016-05-09 MED ORDER — LOSARTAN POTASSIUM 50 MG PO TABS
100.0000 mg | ORAL_TABLET | Freq: Every day | ORAL | Status: DC
  Administered 2016-05-09 – 2016-05-10 (×2): 100 mg via ORAL
  Filled 2016-05-09 (×2): qty 2

## 2016-05-09 MED ORDER — AMLODIPINE BESYLATE 10 MG PO TABS
10.0000 mg | ORAL_TABLET | Freq: Every day | ORAL | Status: DC
  Administered 2016-05-09 – 2016-05-10 (×2): 10 mg via ORAL
  Filled 2016-05-09 (×2): qty 1

## 2016-05-09 MED ORDER — ASPIRIN 81 MG PO CHEW
81.0000 mg | CHEWABLE_TABLET | Freq: Every day | ORAL | Status: DC
  Administered 2016-05-09 – 2016-05-10 (×2): 81 mg via ORAL
  Filled 2016-05-09 (×2): qty 1

## 2016-05-09 NOTE — Progress Notes (Signed)
Verbal order received from Early Osmond, MD for percocet 5-325mg , 1-2 every 4-6 hours PRN for moderate pain, will continue to monitor.

## 2016-05-09 NOTE — Progress Notes (Signed)
Pt arrived with nurse. He's alert and oriented x4. Complains of pain at surgical site. No other complaints. Call bell within reach. Vitals stable. Will continue to monitor.

## 2016-05-09 NOTE — Progress Notes (Signed)
qPhysical Therapy Treatment Patient Details Name: Gregory Russell MRN: [id number] DOB: 21-Mar-1950 Today's Date: 05/09/2016    History of Present Illness Pt is 66 yo male who presents for aorto bi-iliac bypass due to functionally limiting claudication. Other PMH: CHF, OA B hips, PVD, post herpetic neuralgia, GERD    PT Comments    Pt is up to walk on hallway with careful pace as he is very pale and had attempted to reduce O2 last visit without being able to be off.  He is with wife who is very involved  And will be assisting him at home.  Continue acutely as pt is still looking frail to some degree and progress his mobility and strength as tolerated.  HHPT to follow up as planned.  Follow Up Recommendations  Home health PT;Supervision for mobility/OOB     Equipment Recommendations  Rolling walker with 5" wheels    Recommendations for Other Services       Precautions / Restrictions Precautions Precautions: Fall (telemetry) Restrictions Weight Bearing Restrictions: No    Mobility  Bed Mobility Overal bed mobility: Needs Assistance Bed Mobility: Supine to Sit;Sit to Supine Rolling: Min guard   Supine to sit: Min assist;HOB elevated Sit to supine: Min assist;HOB elevated   General bed mobility comments: used railing and prompts for sequence  Transfers Overall transfer level: Needs assistance Equipment used: Rolling walker (2 wheeled) Transfers: Sit to/from [employer] Sit to Stand: Min guard Stand pivot transfers: Min guard       General transfer comment: reminders for sequence to power up  Ambulation/Gait Ambulation/Gait assistance: Min guard Ambulation Distance (Feet): 90 Feet Assistive device: Rolling walker (2 wheeled) Gait Pattern/deviations: Step-through pattern;Decreased stride length;Narrow base of support (able to maintain upright posture) Gait velocity: decreased Gait velocity interpretation: Below normal speed for age/gender General Gait  Details: 3L today due to  adjustment and was 93 to 97%    Stairs            Wheelchair Mobility    Modified Rankin (Stroke Patients Only)       Balance   Sitting-balance support: Feet supported Sitting balance-Leahy Scale: Good     Standing balance support: Bilateral upper extremity supported;During functional activity Standing balance-Leahy Scale: Fair                              Cognition Arousal/Alertness: Awake/alert Behavior During Therapy: WFL for tasks assessed/performed Overall Cognitive Status: Within Functional Limits for tasks assessed                                        Exercises      General Comments        Pertinent Vitals/Pain Pain Assessment: Faces Faces Pain Scale: Hurts little more Pain Location: abdomen Pain Descriptors / Indicators: Operative site guarding Pain Intervention(s): Monitored during session;Premedicated before session;Repositioned    Home Living                      Prior Function            PT Goals (current goals can now be found in the care plan section) Acute Rehab PT Goals Patient Stated Goal: return home Progress towards PT goals: Progressing toward goals    Frequency    Min 3X/week      PT Plan  Current plan remains appropriate    Co-evaluation             End of Session Equipment Utilized During Treatment: Gait belt;Oxygen Activity Tolerance: Patient tolerated treatment well Patient left: in bed;with call bell/phone within reach;with family/visitor present Nurse Communication: Mobility status PT Visit Diagnosis: Unsteadiness on feet (R26.81);Muscle weakness (generalized) (M62.81)     Time: 0932-1000 PT Time Calculation (min) (ACUTE ONLY): 28 min  Charges:  $Gait Training: 8-22 mins $Therapeutic Exercise: 8-22 mins                    G Codes:  Functional Assessment Tool Used: AM-PAC 6 Clicks Basic Mobility      Ivar Drape 05/09/2016, 11:05 AM    Samul Dada, PT MS Acute Rehab Dept. Number: [facility] [id number] [facility] [phone]

## 2016-05-09 NOTE — Discharge Summary (Signed)
AAA Discharge Summary    Gregory Russell [birth year] 65 y.o. male  [id number]  Admission Date: 05/06/2016  Discharge Date: 05/11/16  Physician: Fransisco Hertz, MD  Admission Diagnosis: Peripheral vascular disease with bilateral lower extremity claudication I70.213   HPI:   Gregory Russell is a 66 y.o. (1950/04/22) male with short distance intermittent claudication that is lifestyle limiting.  He underwent angiography which demonstrated a severe distal aortic stenosis with severe bilateral common iliac artery stenoses.  His anatomy was not compatible with endovascular therapy so I recommended: aortobi-iliac bypass, possible aortic endarterectomy and possible aortobifemoral bypass.  He underwent cardiac optimization and risk stratification prior to proceeding with the surgery.  He was found to be low risk.  Hospital Course:  The patient was admitted to the hospital and taken to the operating room on 05/06/2016 and underwent: Aortobifemoral bypass grafting (14mm x 8mm).    Intraoperative findings as follows: 1.  Extensive calcific atherosclerosis in bilateral external iliac arteries 2.  Faintly palpable pedal pulses in both feet  The pt tolerated the procedure well and was transported to the PACU in good condition.   On POD 1, pt was doing well, his a line, cordis and foley were discontinued.  NGT was left in place awaiting bowel function return.  Pt was transferred to step down.   Pt was evaluated by PT and recommended HHPT and RW.  On POD 2, pt ws transferred to the floor.  Continue IS every hour.  NGT still with high output.   ABI's on 05/08/16 as follows: ABI completed: Right ABI of 1.14 and left ABI of 1.08 are suggestive of arterial flow within normal limits at rest. Previous ABI's obtained on 01/28/16 demonstrate an ABI of 0.81 on the right and 0.71 on the left.   RIGHT    LEFT    PRESSURE WAVEFORM  PRESSURE WAVEFORM  BRACHIAL 123 Triphasic BRACHIAL 120 Triphasic    DP 125 Biphasic DP 115 Biphasic  PT 140 Biphasic PT 133 Biphasic    RIGHT LEFT  ABI 1.14 1.08   On POD 3, pt doing well and there was minimal NGT output and this was discontinued.  Advance diet.  Wean oxygen.  Ambulating.   On POD 4-5, his diet was advanced sequentially and by discharge he was tolerating his heart healthy diet.  He is able to ambulate with assistance of rolling walk and independently for short distances.    Per PT recommendations: Home PT was arranged as was a rolling walker.  LABORATORIES  CBC    Component Value Date/Time   WBC 15.6 (H) 05/07/2016 0340   RBC 3.74 (L) 05/07/2016 0340   HGB 11.5 (L) 05/07/2016 0340   HCT 34.0 (L) 05/07/2016 0340   PLT 194 05/07/2016 0340   MCV 90.9 05/07/2016 0340   MCH 30.7 05/07/2016 0340   MCHC 33.8 05/07/2016 0340   RDW 14.4 05/07/2016 0340    BMET    Component Value Date/Time   NA 139 05/07/2016 0340   K 4.3 05/07/2016 0340   CL 104 05/07/2016 0340   CO2 26 05/07/2016 0340   GLUCOSE 173 (H) 05/07/2016 0340   BUN 13 05/07/2016 0340   CREATININE 0.93 05/07/2016 0340   CALCIUM 9.0 05/07/2016 0340   GFRNONAA >60 05/07/2016 0340   GFRAA >60 05/07/2016 0340     Discharge Instructions    ABDOMINAL PROCEDURE/ANEURYSM REPAIR/AORTO-BIFEMORAL BYPASS:  Call MD for increased abdominal pain; cramping diarrhea; nausea/vomiting    Complete by:  As  directed    Call MD for:  redness, tenderness, or signs of infection (pain, swelling, bleeding, redness, odor or green/yellow discharge around incision site)    Complete by:  As directed    Call MD for:  severe or increased pain, loss or decreased feeling  in affected limb(s)    Complete by:  As directed    Call MD for:  temperature >100.5    Complete by:  As directed    Discharge instructions    Complete by:  As directed    While taking pain medication, may want to take a daily stool softener as pain medication can cause constipation.   Discharge wound care:    Complete by:   As directed    Wash the groin wound with soap and water daily and pat dry. (No tub bath-only shower)  Then put a dry gauze or washcloth there to keep this area dry daily and as needed.  Do not use Vaseline or neosporin on your incisions.  Only use soap and water on your incisions and then protect and keep dry.   Driving Restrictions    Complete by:  As directed    No driving for 2 weeks   Lifting restrictions    Complete by:  As directed    No lifting for 4 weeks   Resume previous diet    Complete by:  As directed       Discharge Diagnosis:  Peripheral vascular disease with bilateral lower extremity claudication I70.213  Secondary Diagnosis: Patient Active Problem List   Diagnosis Date Noted  . Bilateral iliac artery stenosis (HCC) 05/06/2016  . Atherosclerosis of native arteries of extremity with intermittent claudication (HCC) 03/13/2016   Past Medical History:  Diagnosis Date  . Arthritis   . CHF (congestive heart failure) (HCC)   . Erectile dysfunction   . GERD (gastroesophageal reflux disease)   . Hypertension   . Peripheral vascular disease (HCC)    claudication  . Personal history of kidney stones   . Post herpetic neuralgia   . Primary osteoarthritis of both hips   . Tobacco dependence      Allergies as of 05/11/2016      Reactions   No Known Allergies       Medication List    TAKE these medications   amLODipine 10 MG tablet Commonly known as:  NORVASC Take 10 mg by mouth daily.   aspirin 81 MG chewable tablet Commonly known as:  ASPIRIN CHILDRENS Chew 1 tablet (81 mg total) by mouth daily.   losartan 50 MG tablet Commonly known as:  COZAAR Take 100 mg by mouth daily.   omeprazole 40 MG capsule Commonly known as:  PRILOSEC Take 40 mg by mouth daily.   oxyCODONE-acetaminophen 5-325 MG tablet Commonly known as:  PERCOCET/ROXICET Take 1-2 tablets by mouth every 6 (six) hours as needed for moderate pain.   sildenafil 100 MG tablet Commonly known  as:  VIAGRA Take 100 mg by mouth daily as needed for erectile dysfunction.       Prescriptions given: 1.  Roxicet #30 No Refill  Instructions: 1.  No driving x 2 weeks and while taking pain medications 2.  No heavy lifting x 4 weeks. 3.  If you have groin incisions, Wash the groin wound with soap and water daily and pat dry. (No tub bath-only shower)  Then put a dry gauze or washcloth there to keep this area dry daily and as needed.  Do not use Vaseline  or neosporin on your incisions.  Only use soap and water on your incisions and then protect and keep dry. 4.  Shower daily starting today 5.  While taking pain medication, may want to take a daily stool softener as pain medication can cause constipation.  Disposition: good  Patient's condition: is Excellent  Follow up: 1. Dr. Imogene Burn in 2 weeks   Doreatha Massed, PA-C [facility] [phone] 05/09/2016  9:27 AM   - For VQI Registry use -  Post-op:  Time to Extubation:  In OR,  < 12 hrs,  12-24 hrs,  >=24 hrs Vasopressors Req. Post-op: No ICU Stay: 1 day in ICU and 1 day in stepdown Transfusion: No    MI: No,  Troponin only,  EKG or Clinical New Arrhythmia: No  Complications: CHF: No Resp failure: No,  Pneumonia,  Ventilator Chg in renal function: No,  Inc. Cr > 0.5,  Temp. Dialysis,  Permanent dialysis Leg ischemia: No, no Surgery needed,  Yes, Surgery needed,  Amputation Bowel ischemia: No,  Medical Rx,  Surgical Rx Wound complication: No,  Superficial separation/infection,  Return to OR Return to OR: No  Return to OR for bleeding: No Stroke: No,  Minor,  Major  Discharge medications: Statin use:  No If No:   ASA use:  Yes  If No:   Plavix use:  No  Beta blocker use:  No  ACEI use:  No ARB use:  Yes CCB use:  Yes Coumadin use:  No

## 2016-05-09 NOTE — Progress Notes (Signed)
Occupational Therapy Treatment Patient Details Name: Gregory Russell MRN: [id number] DOB: 10/30/1950 Today's Date: 05/09/2016    History of present illness Pt is 66 yo male who presents for aorto bi-iliac bypass due to functionally limiting claudication. Other PMH: CHF, OA B hips, PVD, post herpetic neuralgia, GERD   OT comments  Pt is routinely walking to the bathroom and sitting in the chair for most meals. Wife is able to assist pt with bathing and dressing. Pt is not interested in AE education. Tolerating activity well.  Follow Up Recommendations  No OT follow up    Equipment Recommendations  None recommended by OT    Recommendations for Other Services      Precautions / Restrictions Precautions Precautions: Fall       Mobility Bed Mobility Overal bed mobility: Needs Assistance Bed Mobility: Supine to Sit;Sit to Supine     Supine to sit: Min assist;HOB elevated Sit to supine: Min assist;HOB elevated   General bed mobility comments: used railing, assist to raise trunk and for LEs back into bed for comfort  Transfers   Equipment used: Rolling walker (2 wheeled) Transfers: Sit to/from Stand Sit to Stand: Min guard         General transfer comment: cues for hand placement    Balance Overall balance assessment: Needs assistance   Sitting balance-Leahy Scale: Good       Standing balance-Leahy Scale: Fair Standing balance comment: can release walker in standing for grooming                           ADL either performed or assessed with clinical judgement   ADL Overall ADL's : Needs assistance/impaired Eating/Feeding: Independent   Grooming: Wash/dry hands;Standing;Supervision/safety         Lower Body Bathing Details (indicate cue type and reason): recommended long handled bath sponge Upper Body Dressing : Set up;Sitting   Lower Body Dressing: Minimal assistance;Sit to/from stand Lower Body Dressing Details (indicate cue type and  reason): does not want AE, wife can assist as neede Toilet Transfer: Min guard;RW;Ambulation   Toileting- Clothing Manipulation and Hygiene: Supervision/safety;Sit to/from Nurse, children's Details (indicate cue type and reason): wife plans to purchase shower seat Functional mobility during ADLs: Rolling walker;Min guard       Vision       Perception     Praxis      Cognition Arousal/Alertness: Awake/alert Behavior During Therapy: WFL for tasks assessed/performed Overall Cognitive Status: Within Functional Limits for tasks assessed                                          Exercises     Shoulder Instructions       General Comments      Pertinent Vitals/ Pain       Pain Assessment: Faces Faces Pain Scale: Hurts little more Pain Location: abdomen Pain Descriptors / Indicators: Operative site guarding Pain Intervention(s): Monitored during session;Repositioned  Home Living                                          Prior Functioning/Environment              Frequency  Min 2X/week  Progress Toward Goals  OT Goals(current goals can now be found in the care plan section)  Progress towards OT goals: Progressing toward goals  Acute Rehab OT Goals Patient Stated Goal: return home Time For Goal Achievement: 05/21/16 Potential to Achieve Goals: Good  Plan Discharge plan remains appropriate    Co-evaluation                 End of Session Equipment Utilized During Treatment: Gait belt;Rolling walker  OT Visit Diagnosis: Unsteadiness on feet (R26.81);Pain Pain - part of body:  (abdomen)   Activity Tolerance Patient tolerated treatment well   Patient Left in bed;with call bell/phone within reach;with family/visitor present   Nurse Communication          Time: 1545-1600 OT Time Calculation (min): 15 min  Charges: OT General Charges $OT Visit: 1 Procedure OT Treatments $Self Care/Home  Management : 8-22 mins    Evern Bio 05/09/2016, 4:16 PM  [phone]

## 2016-05-09 NOTE — Care Management Important Message (Signed)
Important Message  Patient Details  Name: Gregory Russell MRN: [id number] Date of Birth: 01-Oct-1950   Medicare Important Message Given:  Yes    Kyla Balzarine 05/09/2016, 10:58 AM

## 2016-05-09 NOTE — Progress Notes (Signed)
   Daily Progress Note   Assessment/Planning: POD #3 s/p ABF   Augmentation of BLE ABI noted  Minimal NGT output: d/c ngt, advance diet to clears  OOB and ambulating  Wean oxygen  Home Sun/Early pt of week: rtn of bowel function appears to be slowing factor    Subjective  - 3 Days Post-Op  Pain controlled, no F/BM  Objective Vitals:   05/08/16 1633 05/08/16 2008 05/08/16 2345 05/09/16 0413  BP: (!) 148/69 134/73 135/60 119/63  Pulse: 89 80 71 80  Resp: Temp: 98.8 F (37.1 C) 98.5 F (36.9 C) 98.6 F (37 C) 98.5 F (36.9 C)  TempSrc: Oral Oral Oral Oral  SpO2: 95% 94% 94% 91%  Weight:      Height:         Intake/Output Summary (Last 24 hours) at 05/09/16 0839 Last data filed at 05/09/16 0640  Gross per 24 hour  Intake           1562.5 ml  Output             2850 ml  Net          -1287.5 ml    PULM  BLL rales CV  RRR GI  soft, appropriate TTP, -G/R, inc c/d/I, both groins inc c/d/i VASC  Both feet warm, faintly palpable pedal   RIGHT    LEFT    PRESSURE WAVEFORM  PRESSURE WAVEFORM  BRACHIAL 123 Triphasic BRACHIAL 120 Triphasic  DP 125 Biphasic DP 115 Biphasic  PT 140 Biphasic PT 133 Biphasic    RIGHT LEFT  ABI 1.14 (0.81) 1.08 (0.71)      Laboratory CBC    Component Value Date/Time   WBC 15.6 (H) 05/07/2016 0340   HGB 11.5 (L) 05/07/2016 0340   HCT 34.0 (L) 05/07/2016 0340   PLT 194 05/07/2016 0340    BMET    Component Value Date/Time   NA 139 05/07/2016 0340   K 4.3 05/07/2016 0340   CL 104 05/07/2016 0340   CO2 26 05/07/2016 0340   GLUCOSE 173 (H) 05/07/2016 0340   BUN 13 05/07/2016 0340   CREATININE 0.93 05/07/2016 0340   CALCIUM 9.0 05/07/2016 0340   GFRNONAA >60 05/07/2016 0340   GFRAA >60 05/07/2016 0340     Leonides Sake, MD, FACS [facility] [place] Office: [phone] Pager: [phone]  05/09/2016, 8:39 AM

## 2016-05-10 NOTE — Care Management Note (Signed)
Case Management Note  Patient Details  Name: Gregory Russell MRN: [id number] Date of Birth: 12/29/1950  Subjective/Objective:  66 y.o.M   Pod#4 s/p ABF. Has RW at home. Has chosen [facility] to provide HHPT if MD orders as PT has recommended. Lupita Leash with [facility] aware Anticipate discharge home Sunday or Monday as diet advancing slowly due to poor bowel motility.            Action/Plan:. No further CM needs but will be available should additional discharge needs arise.       Expected Discharge Date:                  Expected Discharge Plan:  Home w Home Health Services  In-House Referral:     Discharge planning Services  CM Consult  Post Acute Care Choice:  Home Health Choice offered to:  Patient  DME Arranged:   (Has RW at home) DME Agency:  NA  HH Arranged:    HH Agency:  [facility] (now [facility])  Status of Service:  In process, will continue to follow  If discussed at Long Length of Stay Meetings, dates discussed:    Additional Comments:  Yvone Neu, RN 05/10/2016, 2:20 PM

## 2016-05-10 NOTE — Progress Notes (Signed)
   Daily Progress Note   Assessment/Planning: POD #4 s/p s/p ABF   Tolerate clears.  Advance to Heart Health diet  Wean off oxygen  Home tomorrow or Monday  Subjective  - 4 Days Post-Op  Pain controlled, +F/-BM, tolerate clears  Objective Vitals:   05/09/16 1027 05/09/16 1423 05/09/16 2031 05/10/16 0423  BP: 119/74 (!) 115/51 121/67 116/70  Pulse: 79 79 83 75  Resp: Temp: 98.5 F (36.9 C) 98.7 F (37.1 C) 98.4 F (36.9 C) 98.6 F (37 C)  TempSrc: Oral Oral Oral Oral  SpO2: 96% 93% 95% 95%  Weight:      Height:         Intake/Output Summary (Last 24 hours) at 05/10/16 0856 Last data filed at 05/10/16 0600  Gross per 24 hour  Intake              720 ml  Output              520 ml  Net              200 ml    PULM  CTAB CV  RRR GI  soft, appropriate TTP, -G/R, inc c/d/I, B groin inc c/d/I VASC  Faintly palpable pedal pulses B  Laboratory CBC    Component Value Date/Time   WBC 15.6 (H) 05/07/2016 0340   HGB 11.5 (L) 05/07/2016 0340   HCT 34.0 (L) 05/07/2016 0340   PLT 194 05/07/2016 0340    BMET    Component Value Date/Time   NA 139 05/07/2016 0340   K 4.3 05/07/2016 0340   CL 104 05/07/2016 0340   CO2 26 05/07/2016 0340   GLUCOSE 173 (H) 05/07/2016 0340   BUN 13 05/07/2016 0340   CREATININE 0.93 05/07/2016 0340   CALCIUM 9.0 05/07/2016 0340   GFRNONAA >60 05/07/2016 0340   GFRAA >60 05/07/2016 0340     Leonides Sake, MD, FACS [facility] [place] Office: [phone] Pager: [phone]  05/10/2016, 8:56 AM

## 2016-05-11 MED ORDER — OXYCODONE-ACETAMINOPHEN 5-325 MG PO TABS: 1.0000 | ORAL_TABLET | Freq: Four times a day (QID) | ORAL | 0 refills | Status: DC | PRN

## 2016-05-11 NOTE — Progress Notes (Addendum)
   Daily Progress Note   Assessment/Planning: POD #5 s/p ABF   Tolerated Heart Healthy  F/U 10 days for staple removal  Ok to D/C  Subjective  - 5 Days Post-Op  Pain control, ate lunch and dinner without difficulty, +F/+BM  Objective Vitals:   05/09/16 2031 05/10/16 0423 05/10/16 2040 05/11/16 0529  BP: 121/67 116/70 (!) 122/53 108/61  Pulse: 83 75 73 93  Resp: Temp: 98.4 F (36.9 C) 98.6 F (37 C) 98.5 F (36.9 C) 98 F (36.7 C)  TempSrc: Oral Oral Oral Oral  SpO2: 95% 95% 92% 90%  Weight:      Height:         Intake/Output Summary (Last 24 hours) at 05/11/16 0738 Last data filed at 05/10/16 1800  Gross per 24 hour  Intake              480 ml  Output                0 ml  Net              480 ml    PULM  CTAB CV  RRR GI  soft, appropriate TTP, -G/R, all inc c/d/i VASC  Warm feet with palpable PT  Laboratory CBC    Component Value Date/Time   WBC 15.6 (H) 05/07/2016 0340   HGB 11.5 (L) 05/07/2016 0340   HCT 34.0 (L) 05/07/2016 0340   PLT 194 05/07/2016 0340    BMET    Component Value Date/Time   NA 139 05/07/2016 0340   K 4.3 05/07/2016 0340   CL 104 05/07/2016 0340   CO2 26 05/07/2016 0340   GLUCOSE 173 (H) 05/07/2016 0340   BUN 13 05/07/2016 0340   CREATININE 0.93 05/07/2016 0340   CALCIUM 9.0 05/07/2016 0340   GFRNONAA >60 05/07/2016 0340   GFRAA >60 05/07/2016 0340     Leonides Sake, MD, FACS [facility] [place] Office: [phone] Pager: [phone]  05/11/2016, 7:38 AM

## 2016-05-11 NOTE — Progress Notes (Signed)
   Daily Progress Note  Accidentally duplicated my PA pain prescription on this patient.  I disposed of my PA's prescription in the shred basket.  Leonides Sake, MD, FACS [facility] [place] Office: [phone] Pager: [phone]  05/11/2016, 7:45 AM

## 2016-05-12 ENCOUNTER — Telehealth: Payer: Self-pay | Admitting: Vascular Surgery

## 2016-05-12 ENCOUNTER — Encounter (HOSPITAL_COMMUNITY): Payer: Self-pay | Admitting: Vascular Surgery

## 2016-05-12 LAB — BLOOD GAS, ARTERIAL
Acid-Base Excess: 0.7 mmol/L (ref 0.0–2.0)
Bicarbonate: 24.8 mmol/L (ref 20.0–28.0)
DRAWN BY: 449841
FIO2: 21
O2 SAT: 95.7 %
PATIENT TEMPERATURE: 98.6
PO2 ART: 80.4 mmHg — AB (ref 83.0–108.0)
pCO2 arterial: 39.7 mmHg (ref 32.0–48.0)
pH, Arterial: 7.412 (ref 7.350–7.450)

## 2016-05-12 NOTE — Telephone Encounter (Signed)
-----   Message from Sharee Pimple, RN sent at 05/11/2016  5:33 PM EDT ----- Regarding: 2 weeks   ----- Message ----- From: Dara Lords, PA-C Sent: 05/09/2016   9:36 AM To: Vvs Charge Pool  s/p aortobifem bypass.  Fu with Dr. Imogene Burn in 2 weeks.  thanks

## 2016-05-12 NOTE — Telephone Encounter (Signed)
spoke to pt on mobile #, mailed lttr for appt on 4/20

## 2016-05-19 ENCOUNTER — Encounter: Payer: Self-pay | Admitting: Vascular Surgery

## 2016-05-26 NOTE — Progress Notes (Addendum)
    Post-operative Open AAA Repair  History of Present Illness  Gregory Russell is a 66 y.o. male who presents post-op s/p ABF for severe distal aortic stenosis and severe bilateral common iliac artery stenosis not amendable to endovascular intervention (Date: 05/06/16).  The patient's sx are: improved leg sx.  For VQI Use Only  PRE-ADM LIVING: Home  AMB STATUS: Ambulatory   Physical Examination  Vitals:   05/30/16 1107  BP: (!) 150/80  Pulse: 83  Resp: 16  Temp: 97.9 F (36.6 C)  TempSrc: Oral  SpO2: 97%  Weight: 134 lb (60.8 kg)  Height:  (1.702 m)    Gastrointestinal: soft, NTND, -G/R, - HSM, - masses, - CVAT B, inc healed, staples still in place   Medical Decision Making  Gregory Russell is a 66 y.o. male who presents s/p ABF   ABF essentially has resolved his preop sx.  We discussed the need to protect the graft from bacteremia, so abx prophylaxis for any significant dental or surgical procedures.  Low index for abx use is recommended as aortic graft infections are associated with 50-100% mortality rate.  Will plan on aortoiliac duplex in one year to evaluate aortic limbs and proximal anastomosis along with BLE ABI.  Thank you for allowing [place] to participate in this patient's care.   Leonides Sake, MD, FACS [facility] [place] Office: [phone] Pager: [phone]

## 2016-05-30 ENCOUNTER — Ambulatory Visit (INDEPENDENT_AMBULATORY_CARE_PROVIDER_SITE_OTHER): Payer: Self-pay | Admitting: Vascular Surgery

## 2016-05-30 ENCOUNTER — Encounter: Payer: Self-pay | Admitting: Vascular Surgery

## 2016-05-30 VITALS — BP 150/80 | HR 83 | Temp 97.9°F | Resp 16 | Ht 67.0 in | Wt 134.0 lb

## 2016-05-30 DIAGNOSIS — I771 Stricture of artery: Secondary | ICD-10-CM

## 2016-05-30 DIAGNOSIS — I70213 Atherosclerosis of native arteries of extremities with intermittent claudication, bilateral legs: Secondary | ICD-10-CM

## 2016-06-02 NOTE — Addendum Note (Signed)
Addended by: Burton Apley A on: 06/02/2016 10:14 AM   Modules accepted: Orders

## 2017-06-01 NOTE — Progress Notes (Signed)
Established Intermittent Claudication   History of Present Illness   Gregory Russell is a 67 y.o. (09/12/1950) male who presents with chief complaint: no sx.    Prior procedures include: 1.  ABF for occupation limiting intermittent claudication found to be due to severe distal aortic stenosis.  Post-operatively, the ABF resolved this patient's sx completely.  The patient's symptoms have not progressed.  The patient's symptoms are: none.  The patient's treatment regimen currently included: maximal medical management.  The patient's PMH, PSH, SH, and FamHx were reviewed on 06/07/17 are unchanged from 05/30/16.  Current Outpatient Medications  Medication Sig Dispense Refill  . amLODipine (NORVASC) 10 MG tablet Take 10 mg by mouth daily.    Marland Kitchen aspirin (ASPIRIN CHILDRENS) 81 MG chewable tablet Chew 1 tablet (81 mg total) by mouth daily. 90 tablet 3  . losartan (COZAAR) 50 MG tablet Take 100 mg by mouth daily.     Marland Kitchen omeprazole (PRILOSEC) 40 MG capsule Take 40 mg by mouth daily.    . sildenafil (VIAGRA) 100 MG tablet Take 100 mg by mouth daily as needed for erectile dysfunction.     No current facility-administered medications for this visit.     On ROS today: no intermittent claudication , resolution of all wound issues.   Physical Examination   Vitals:   06/03/17 0920  BP: 131/80  Pulse: 64  Resp: 16  Temp: (!) 97 F (36.1 C)  TempSrc: Oral  SpO2: 97%  Weight: 134 lb (60.8 kg)  Height: 5\' 7"  (1.702 m)   Body mass index is 20.99 kg/m.  General Alert, O x 3, WD, NAD  Pulmonary Sym exp, good B air movt, CTA B  Cardiac RRR, Nl S1, S2, no Murmurs, No rubs, No S3,S4  Vascular Vessel Right Left  Radial Palpable Palpable  Brachial Palpable Palpable  Carotid Palpable, No Bruit Palpable, No Bruit  Aorta Not palpable N/A  Femoral Palpable Palpable  Popliteal Not palpable Not palpable  PT Palpable Palpable  DP Palpable Palpable    Gastro- intestinal soft, non-distended,  non-tender to palpation, No guarding or rebound, no HSM, no masses, no CVAT B, No palpable prominent aortic pulse, no incisional hernia, both groin incision well healed    Musculo- skeletal M/S 5/5 throughout  , Extremities without ischemic changes  , No edema present, No visible varicosities , No Lipodermatosclerosis present  Neurologic Pain and light touch intact in extremities , Motor exam as listed above    Non-Invasive Vascular imaging   ABI (06/03/2017)  R:   ABI: 1.06 (1.14),   PT: tri  DP: tri  TBI:  0.81  L:   ABI: 1.21 (1.08),   PT: tri  DP: tri  TBI: 0.84  Aortoiliac Duplex (06/03/2017 )  Ao: 54-167 c/s  R ABF limb: 74-164 c/s  L ABF limb: 84-164 c/s  All anastomosis widely patent   Medical Decision Making   Gregory Russell is a 67 y.o. male who presents with:  S/p ABF for occupation limiting bilateral leg intermittent claudication without evidence of critical limb ischemia due to distal aortic stenosis.   Based on the patient's vascular studies and examination, I have offered the patient: annual aortoiliac duplex and BLE ABI.  I discussed in depth with the patient the nature of atherosclerosis, and emphasized the importance of maximal medical management including strict control of blood pressure, blood glucose, and lipid levels, antiplatelet agents, obtaining regular exercise, and cessation of smoking.    The patient  is aware that without maximal medical management the underlying atherosclerotic disease process will progress, limiting the benefit of any interventions.  The patient is currently not on on statin as not medically indicated.   The patient is currently on an anti-platelet: ASA.  Thank you for allowing us to participate in this patient's care.   Leonides SakeBrian Anneka Studer, MD, FACS [facility] [place] Office: [phone] Pager: [phone]

## 2017-06-03 ENCOUNTER — Ambulatory Visit (INDEPENDENT_AMBULATORY_CARE_PROVIDER_SITE_OTHER)
Admission: RE | Admit: 2017-06-03 | Discharge: 2017-06-03 | Disposition: A | Discharge status: Home / Self Care | Payer: Medicare Other | Source: Ambulatory Visit | Attending: Vascular Surgery | Admitting: Vascular Surgery

## 2017-06-03 ENCOUNTER — Ambulatory Visit (HOSPITAL_COMMUNITY)
Admission: RE | Admit: 2017-06-03 | Discharge: 2017-06-03 | Disposition: A | Discharge status: Home / Self Care | Payer: Medicare Other | Source: Ambulatory Visit | Attending: Vascular Surgery | Admitting: Vascular Surgery

## 2017-06-03 ENCOUNTER — Other Ambulatory Visit: Payer: Self-pay

## 2017-06-03 ENCOUNTER — Ambulatory Visit (INDEPENDENT_AMBULATORY_CARE_PROVIDER_SITE_OTHER): Payer: Medicare Other | Admitting: Vascular Surgery

## 2017-06-03 ENCOUNTER — Encounter: Payer: Self-pay | Admitting: Vascular Surgery

## 2017-06-03 VITALS — BP 131/80 | HR 64 | Temp 97.0°F | Resp 16 | Ht 67.0 in | Wt 134.0 lb

## 2017-06-03 DIAGNOSIS — I771 Stricture of artery: Secondary | ICD-10-CM | POA: Insufficient documentation

## 2017-06-03 DIAGNOSIS — Z95828 Presence of other vascular implants and grafts: Secondary | ICD-10-CM | POA: Diagnosis not present

## 2017-06-03 DIAGNOSIS — Z48812 Encounter for surgical aftercare following surgery on the circulatory system: Secondary | ICD-10-CM | POA: Insufficient documentation

## 2017-06-03 DIAGNOSIS — I10 Essential (primary) hypertension: Secondary | ICD-10-CM | POA: Diagnosis not present

## 2017-06-03 DIAGNOSIS — E785 Hyperlipidemia, unspecified: Secondary | ICD-10-CM | POA: Diagnosis not present

## 2017-06-03 DIAGNOSIS — Z87891 Personal history of nicotine dependence: Secondary | ICD-10-CM | POA: Insufficient documentation

## 2017-06-03 DIAGNOSIS — I70213 Atherosclerosis of native arteries of extremities with intermittent claudication, bilateral legs: Secondary | ICD-10-CM

## 2017-06-03 DIAGNOSIS — Z8679 Personal history of other diseases of the circulatory system: Secondary | ICD-10-CM | POA: Diagnosis not present

## 2017-06-03 DIAGNOSIS — I708 Atherosclerosis of other arteries: Secondary | ICD-10-CM | POA: Diagnosis present

## 2017-06-05 ENCOUNTER — Ambulatory Visit: Payer: Medicare Other | Admitting: Vascular Surgery

## 2017-06-05 ENCOUNTER — Encounter (HOSPITAL_COMMUNITY): Payer: Medicare Other

## 2018-08-14 IMAGING — CR DG ABD PORTABLE 1V
1 series · 1 of 1 positions shown · non-contrast
Comparison: None.

CLINICAL DATA: Status post aortobifemoral bypass graft

EXAM:
PORTABLE ABDOMEN - 1 VIEW

[AP]
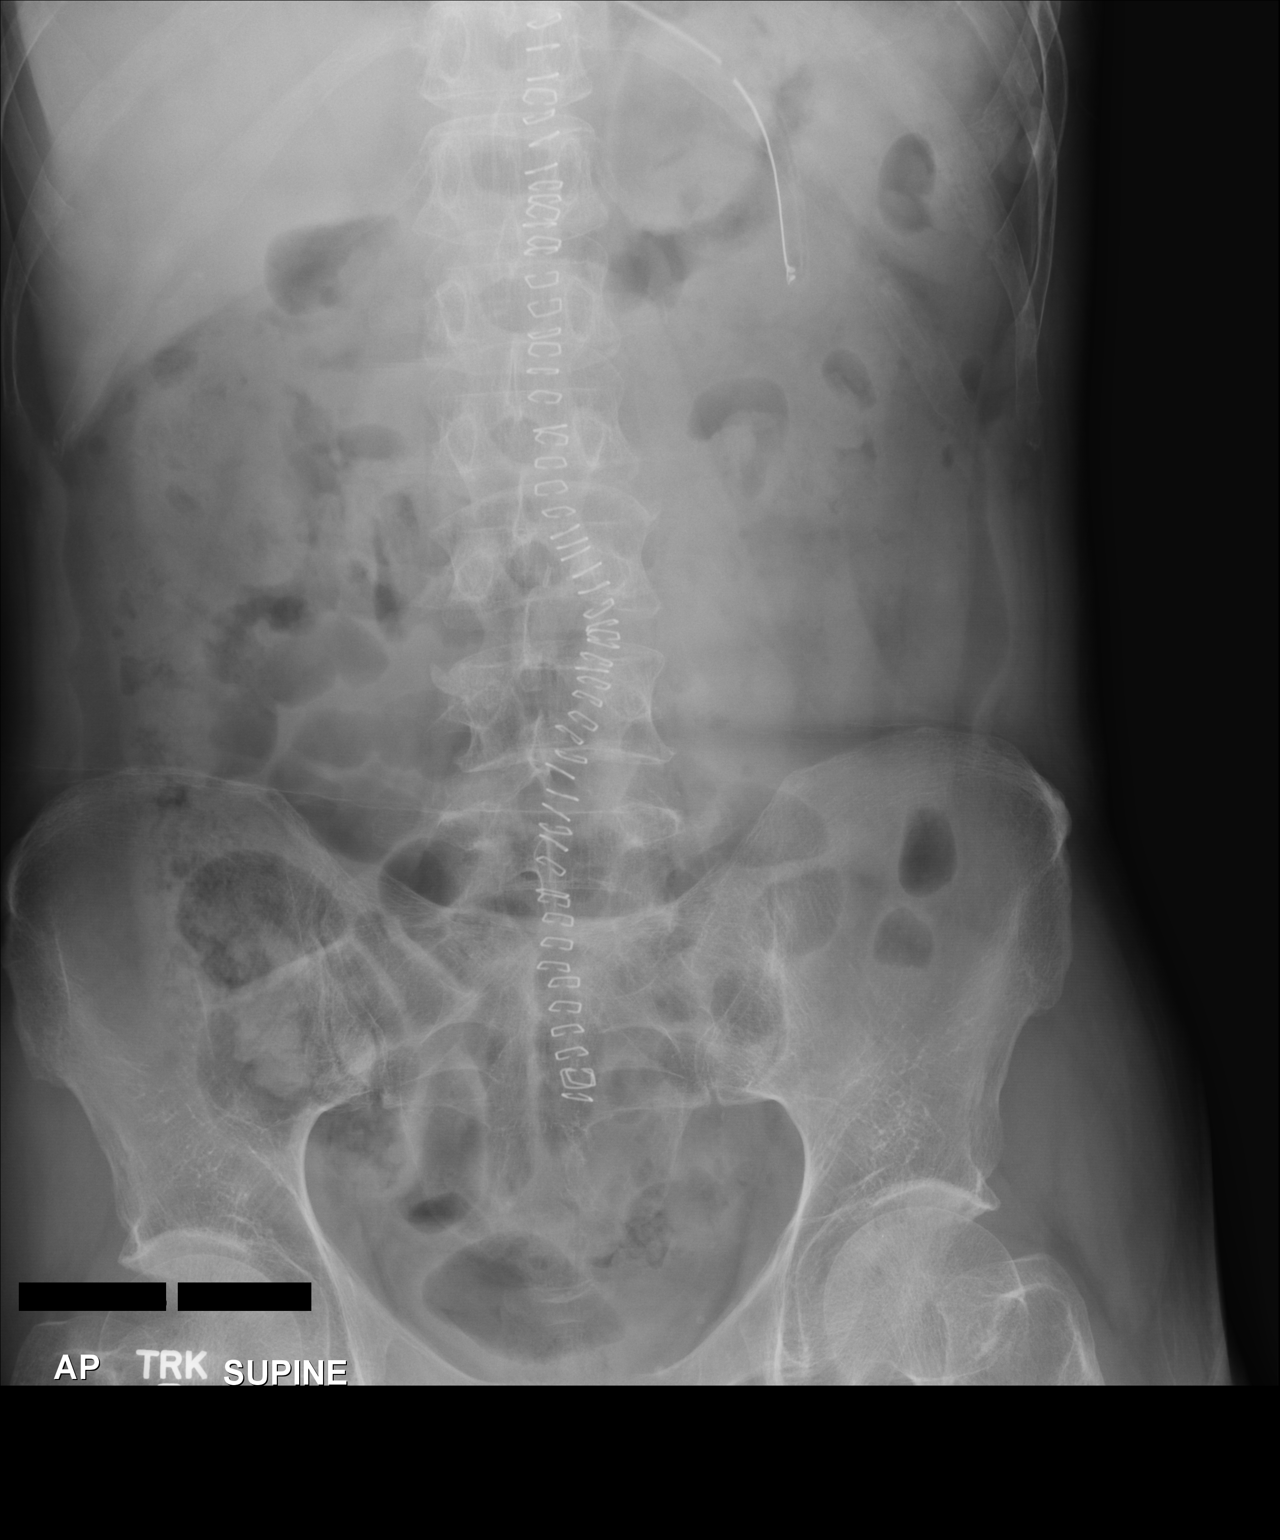

[1 of 1 positions shown; findings below may reference images not displayed]

FINDINGS: Postsurgical changes are noted. Nasogastric catheter is seen within
the stomach. Scattered large and small bowel gas is noted. No bony
abnormality is seen.
IMPRESSION: Nasogastric catheter within the stomach.

## 2019-07-18 DIAGNOSIS — Z20822 Contact with and (suspected) exposure to covid-19: Secondary | ICD-10-CM | POA: Diagnosis not present

## 2019-07-18 DIAGNOSIS — J441 Chronic obstructive pulmonary disease with (acute) exacerbation: Secondary | ICD-10-CM | POA: Diagnosis not present

## 2019-11-29 DIAGNOSIS — Z20822 Contact with and (suspected) exposure to covid-19: Secondary | ICD-10-CM | POA: Diagnosis not present

## 2019-11-29 DIAGNOSIS — Z125 Encounter for screening for malignant neoplasm of prostate: Secondary | ICD-10-CM | POA: Diagnosis not present

## 2019-11-29 DIAGNOSIS — E7800 Pure hypercholesterolemia, unspecified: Secondary | ICD-10-CM | POA: Diagnosis not present

## 2019-11-29 DIAGNOSIS — Z23 Encounter for immunization: Secondary | ICD-10-CM | POA: Diagnosis not present

## 2019-11-29 DIAGNOSIS — I739 Peripheral vascular disease, unspecified: Secondary | ICD-10-CM | POA: Diagnosis not present

## 2019-11-29 DIAGNOSIS — I10 Essential (primary) hypertension: Secondary | ICD-10-CM | POA: Diagnosis not present

## 2019-11-29 DIAGNOSIS — Z681 Body mass index (BMI) 19 or less, adult: Secondary | ICD-10-CM | POA: Diagnosis not present

## 2019-11-29 DIAGNOSIS — K219 Gastro-esophageal reflux disease without esophagitis: Secondary | ICD-10-CM | POA: Diagnosis not present

## 2019-11-29 DIAGNOSIS — Z79899 Other long term (current) drug therapy: Secondary | ICD-10-CM | POA: Diagnosis not present

## 2020-01-04 DIAGNOSIS — D72829 Elevated white blood cell count, unspecified: Secondary | ICD-10-CM | POA: Diagnosis not present

## 2020-02-09 DIAGNOSIS — E785 Hyperlipidemia, unspecified: Secondary | ICD-10-CM | POA: Diagnosis not present

## 2020-02-09 DIAGNOSIS — Z0000 Encounter for general adult medical examination without abnormal findings: Secondary | ICD-10-CM | POA: Diagnosis not present

## 2020-02-09 DIAGNOSIS — Z9181 History of falling: Secondary | ICD-10-CM | POA: Diagnosis not present

## 2020-02-09 DIAGNOSIS — Z1331 Encounter for screening for depression: Secondary | ICD-10-CM | POA: Diagnosis not present

## 2020-03-19 DIAGNOSIS — K219 Gastro-esophageal reflux disease without esophagitis: Secondary | ICD-10-CM | POA: Diagnosis not present

## 2020-03-19 DIAGNOSIS — E785 Hyperlipidemia, unspecified: Secondary | ICD-10-CM | POA: Diagnosis not present

## 2020-03-19 DIAGNOSIS — N529 Male erectile dysfunction, unspecified: Secondary | ICD-10-CM | POA: Diagnosis not present

## 2020-03-19 DIAGNOSIS — Z7982 Long term (current) use of aspirin: Secondary | ICD-10-CM | POA: Diagnosis not present

## 2020-03-19 DIAGNOSIS — Z950 Presence of cardiac pacemaker: Secondary | ICD-10-CM | POA: Diagnosis not present

## 2020-03-19 DIAGNOSIS — Z720 Tobacco use: Secondary | ICD-10-CM | POA: Diagnosis not present

## 2020-03-19 DIAGNOSIS — I739 Peripheral vascular disease, unspecified: Secondary | ICD-10-CM | POA: Diagnosis not present

## 2020-03-19 DIAGNOSIS — I10 Essential (primary) hypertension: Secondary | ICD-10-CM | POA: Diagnosis not present

## 2020-03-19 DIAGNOSIS — I951 Orthostatic hypotension: Secondary | ICD-10-CM | POA: Diagnosis not present

## 2020-06-24 DIAGNOSIS — J069 Acute upper respiratory infection, unspecified: Secondary | ICD-10-CM | POA: Diagnosis not present

## 2020-07-11 DEATH — deceased

## 2020-07-18 DIAGNOSIS — I739 Peripheral vascular disease, unspecified: Secondary | ICD-10-CM | POA: Diagnosis not present

## 2020-07-18 DIAGNOSIS — K4090 Unilateral inguinal hernia, without obstruction or gangrene, not specified as recurrent: Secondary | ICD-10-CM | POA: Diagnosis not present

## 2020-07-18 DIAGNOSIS — E7800 Pure hypercholesterolemia, unspecified: Secondary | ICD-10-CM | POA: Diagnosis not present

## 2020-07-18 DIAGNOSIS — I10 Essential (primary) hypertension: Secondary | ICD-10-CM | POA: Diagnosis not present

## 2020-07-18 DIAGNOSIS — K219 Gastro-esophageal reflux disease without esophagitis: Secondary | ICD-10-CM | POA: Diagnosis not present

## 2020-07-18 DIAGNOSIS — L570 Actinic keratosis: Secondary | ICD-10-CM | POA: Diagnosis not present

## 2020-07-18 DIAGNOSIS — Z87891 Personal history of nicotine dependence: Secondary | ICD-10-CM | POA: Diagnosis not present

## 2020-07-18 DIAGNOSIS — Z139 Encounter for screening, unspecified: Secondary | ICD-10-CM | POA: Diagnosis not present

## 2020-07-18 DIAGNOSIS — Z681 Body mass index (BMI) 19 or less, adult: Secondary | ICD-10-CM | POA: Diagnosis not present

## 2020-08-06 DIAGNOSIS — Z122 Encounter for screening for malignant neoplasm of respiratory organs: Secondary | ICD-10-CM | POA: Diagnosis not present

## 2020-08-06 DIAGNOSIS — F17210 Nicotine dependence, cigarettes, uncomplicated: Secondary | ICD-10-CM | POA: Diagnosis not present

## 2020-11-19 DIAGNOSIS — K219 Gastro-esophageal reflux disease without esophagitis: Secondary | ICD-10-CM | POA: Diagnosis not present

## 2020-11-19 DIAGNOSIS — I739 Peripheral vascular disease, unspecified: Secondary | ICD-10-CM | POA: Diagnosis not present

## 2020-11-19 DIAGNOSIS — Z125 Encounter for screening for malignant neoplasm of prostate: Secondary | ICD-10-CM | POA: Diagnosis not present

## 2020-11-19 DIAGNOSIS — Z87891 Personal history of nicotine dependence: Secondary | ICD-10-CM | POA: Diagnosis not present

## 2020-11-19 DIAGNOSIS — R319 Hematuria, unspecified: Secondary | ICD-10-CM | POA: Diagnosis not present

## 2020-11-19 DIAGNOSIS — J441 Chronic obstructive pulmonary disease with (acute) exacerbation: Secondary | ICD-10-CM | POA: Diagnosis not present

## 2020-11-19 DIAGNOSIS — Z79899 Other long term (current) drug therapy: Secondary | ICD-10-CM | POA: Diagnosis not present

## 2020-11-19 DIAGNOSIS — E7800 Pure hypercholesterolemia, unspecified: Secondary | ICD-10-CM | POA: Diagnosis not present

## 2020-11-19 DIAGNOSIS — I10 Essential (primary) hypertension: Secondary | ICD-10-CM | POA: Diagnosis not present

## 2020-12-20 DIAGNOSIS — D72829 Elevated white blood cell count, unspecified: Secondary | ICD-10-CM | POA: Diagnosis not present

## 2020-12-28 DIAGNOSIS — I739 Peripheral vascular disease, unspecified: Secondary | ICD-10-CM | POA: Diagnosis not present

## 2020-12-28 DIAGNOSIS — J449 Chronic obstructive pulmonary disease, unspecified: Secondary | ICD-10-CM | POA: Diagnosis not present

## 2020-12-28 DIAGNOSIS — R319 Hematuria, unspecified: Secondary | ICD-10-CM | POA: Diagnosis not present

## 2020-12-28 DIAGNOSIS — I700 Atherosclerosis of aorta: Secondary | ICD-10-CM | POA: Diagnosis not present

## 2020-12-28 DIAGNOSIS — E7800 Pure hypercholesterolemia, unspecified: Secondary | ICD-10-CM | POA: Diagnosis not present

## 2021-01-02 DIAGNOSIS — R319 Hematuria, unspecified: Secondary | ICD-10-CM | POA: Diagnosis not present

## 2021-01-02 DIAGNOSIS — N400 Enlarged prostate without lower urinary tract symptoms: Secondary | ICD-10-CM | POA: Diagnosis not present

## 2021-01-02 DIAGNOSIS — N3289 Other specified disorders of bladder: Secondary | ICD-10-CM | POA: Diagnosis not present

## 2021-01-02 DIAGNOSIS — R93421 Abnormal radiologic findings on diagnostic imaging of right kidney: Secondary | ICD-10-CM | POA: Diagnosis not present

## 2021-01-02 DIAGNOSIS — R93422 Abnormal radiologic findings on diagnostic imaging of left kidney: Secondary | ICD-10-CM | POA: Diagnosis not present

## 2021-01-02 DIAGNOSIS — R9341 Abnormal radiologic findings on diagnostic imaging of renal pelvis, ureter, or bladder: Secondary | ICD-10-CM | POA: Diagnosis not present

## 2021-02-04 DIAGNOSIS — J209 Acute bronchitis, unspecified: Secondary | ICD-10-CM | POA: Diagnosis not present

## 2021-02-14 DIAGNOSIS — Z0000 Encounter for general adult medical examination without abnormal findings: Secondary | ICD-10-CM | POA: Diagnosis not present

## 2021-02-14 DIAGNOSIS — Z23 Encounter for immunization: Secondary | ICD-10-CM | POA: Diagnosis not present

## 2021-02-14 DIAGNOSIS — Z1331 Encounter for screening for depression: Secondary | ICD-10-CM | POA: Diagnosis not present

## 2021-02-14 DIAGNOSIS — Z9181 History of falling: Secondary | ICD-10-CM | POA: Diagnosis not present

## 2021-02-14 DIAGNOSIS — E785 Hyperlipidemia, unspecified: Secondary | ICD-10-CM | POA: Diagnosis not present

## 2021-02-15 DIAGNOSIS — R319 Hematuria, unspecified: Secondary | ICD-10-CM | POA: Diagnosis not present

## 2021-04-17 DIAGNOSIS — H26493 Other secondary cataract, bilateral: Secondary | ICD-10-CM | POA: Diagnosis not present

## 2021-04-19 DIAGNOSIS — I10 Essential (primary) hypertension: Secondary | ICD-10-CM | POA: Diagnosis not present

## 2021-04-19 DIAGNOSIS — K219 Gastro-esophageal reflux disease without esophagitis: Secondary | ICD-10-CM | POA: Diagnosis not present

## 2021-04-19 DIAGNOSIS — E785 Hyperlipidemia, unspecified: Secondary | ICD-10-CM | POA: Diagnosis not present

## 2021-04-19 DIAGNOSIS — F17210 Nicotine dependence, cigarettes, uncomplicated: Secondary | ICD-10-CM | POA: Diagnosis not present

## 2021-04-19 DIAGNOSIS — I700 Atherosclerosis of aorta: Secondary | ICD-10-CM | POA: Diagnosis not present

## 2021-04-19 DIAGNOSIS — Z7982 Long term (current) use of aspirin: Secondary | ICD-10-CM | POA: Diagnosis not present

## 2021-05-20 DIAGNOSIS — E7800 Pure hypercholesterolemia, unspecified: Secondary | ICD-10-CM | POA: Diagnosis not present

## 2021-05-20 DIAGNOSIS — K219 Gastro-esophageal reflux disease without esophagitis: Secondary | ICD-10-CM | POA: Diagnosis not present

## 2021-05-20 DIAGNOSIS — Z6820 Body mass index (BMI) 20.0-20.9, adult: Secondary | ICD-10-CM | POA: Diagnosis not present

## 2021-05-20 DIAGNOSIS — Z87891 Personal history of nicotine dependence: Secondary | ICD-10-CM | POA: Diagnosis not present

## 2021-05-20 DIAGNOSIS — I739 Peripheral vascular disease, unspecified: Secondary | ICD-10-CM | POA: Diagnosis not present

## 2021-05-20 DIAGNOSIS — M545 Low back pain, unspecified: Secondary | ICD-10-CM | POA: Diagnosis not present

## 2021-05-20 DIAGNOSIS — I10 Essential (primary) hypertension: Secondary | ICD-10-CM | POA: Diagnosis not present

## 2021-05-31 DIAGNOSIS — H26491 Other secondary cataract, right eye: Secondary | ICD-10-CM | POA: Diagnosis not present

## 2021-11-10 DIAGNOSIS — R051 Acute cough: Secondary | ICD-10-CM | POA: Diagnosis not present

## 2021-11-10 DIAGNOSIS — J189 Pneumonia, unspecified organism: Secondary | ICD-10-CM | POA: Diagnosis not present

## 2021-11-26 DIAGNOSIS — I739 Peripheral vascular disease, unspecified: Secondary | ICD-10-CM | POA: Diagnosis not present

## 2021-11-26 DIAGNOSIS — R809 Proteinuria, unspecified: Secondary | ICD-10-CM | POA: Diagnosis not present

## 2021-11-26 DIAGNOSIS — Z79899 Other long term (current) drug therapy: Secondary | ICD-10-CM | POA: Diagnosis not present

## 2021-11-26 DIAGNOSIS — I10 Essential (primary) hypertension: Secondary | ICD-10-CM | POA: Diagnosis not present

## 2021-11-26 DIAGNOSIS — E7800 Pure hypercholesterolemia, unspecified: Secondary | ICD-10-CM | POA: Diagnosis not present

## 2021-11-26 DIAGNOSIS — Z125 Encounter for screening for malignant neoplasm of prostate: Secondary | ICD-10-CM | POA: Diagnosis not present

## 2021-11-26 DIAGNOSIS — K219 Gastro-esophageal reflux disease without esophagitis: Secondary | ICD-10-CM | POA: Diagnosis not present

## 2021-11-26 DIAGNOSIS — Z23 Encounter for immunization: Secondary | ICD-10-CM | POA: Diagnosis not present

## 2021-12-30 DIAGNOSIS — R509 Fever, unspecified: Secondary | ICD-10-CM | POA: Diagnosis not present

## 2021-12-30 DIAGNOSIS — J069 Acute upper respiratory infection, unspecified: Secondary | ICD-10-CM | POA: Diagnosis not present

## 2022-02-20 DIAGNOSIS — K219 Gastro-esophageal reflux disease without esophagitis: Secondary | ICD-10-CM | POA: Diagnosis not present

## 2022-02-20 DIAGNOSIS — E785 Hyperlipidemia, unspecified: Secondary | ICD-10-CM | POA: Diagnosis not present

## 2022-02-20 DIAGNOSIS — N182 Chronic kidney disease, stage 2 (mild): Secondary | ICD-10-CM | POA: Diagnosis not present

## 2022-02-20 DIAGNOSIS — Z720 Tobacco use: Secondary | ICD-10-CM | POA: Diagnosis not present

## 2022-02-20 DIAGNOSIS — R809 Proteinuria, unspecified: Secondary | ICD-10-CM | POA: Diagnosis not present

## 2022-02-20 DIAGNOSIS — I129 Hypertensive chronic kidney disease with stage 1 through stage 4 chronic kidney disease, or unspecified chronic kidney disease: Secondary | ICD-10-CM | POA: Diagnosis not present

## 2022-03-11 DIAGNOSIS — R809 Proteinuria, unspecified: Secondary | ICD-10-CM | POA: Diagnosis not present

## 2022-04-17 DIAGNOSIS — E785 Hyperlipidemia, unspecified: Secondary | ICD-10-CM | POA: Diagnosis not present

## 2022-04-17 DIAGNOSIS — N182 Chronic kidney disease, stage 2 (mild): Secondary | ICD-10-CM | POA: Diagnosis not present

## 2022-04-17 DIAGNOSIS — I129 Hypertensive chronic kidney disease with stage 1 through stage 4 chronic kidney disease, or unspecified chronic kidney disease: Secondary | ICD-10-CM | POA: Diagnosis not present

## 2022-04-17 DIAGNOSIS — R809 Proteinuria, unspecified: Secondary | ICD-10-CM | POA: Diagnosis not present

## 2022-04-28 DIAGNOSIS — R809 Proteinuria, unspecified: Secondary | ICD-10-CM | POA: Diagnosis not present

## 2022-05-26 DIAGNOSIS — Z79899 Other long term (current) drug therapy: Secondary | ICD-10-CM | POA: Diagnosis not present

## 2022-05-26 DIAGNOSIS — K219 Gastro-esophageal reflux disease without esophagitis: Secondary | ICD-10-CM | POA: Diagnosis not present

## 2022-05-26 DIAGNOSIS — Z139 Encounter for screening, unspecified: Secondary | ICD-10-CM | POA: Diagnosis not present

## 2022-05-26 DIAGNOSIS — Z1331 Encounter for screening for depression: Secondary | ICD-10-CM | POA: Diagnosis not present

## 2022-05-26 DIAGNOSIS — I10 Essential (primary) hypertension: Secondary | ICD-10-CM | POA: Diagnosis not present

## 2022-05-26 DIAGNOSIS — N182 Chronic kidney disease, stage 2 (mild): Secondary | ICD-10-CM | POA: Diagnosis not present

## 2022-05-26 DIAGNOSIS — E7800 Pure hypercholesterolemia, unspecified: Secondary | ICD-10-CM | POA: Diagnosis not present

## 2022-05-26 DIAGNOSIS — I739 Peripheral vascular disease, unspecified: Secondary | ICD-10-CM | POA: Diagnosis not present

## 2022-05-26 DIAGNOSIS — R809 Proteinuria, unspecified: Secondary | ICD-10-CM | POA: Diagnosis not present

## 2022-05-26 DIAGNOSIS — Z9181 History of falling: Secondary | ICD-10-CM | POA: Diagnosis not present

## 2022-06-17 DIAGNOSIS — Z87891 Personal history of nicotine dependence: Secondary | ICD-10-CM | POA: Diagnosis not present

## 2022-06-17 DIAGNOSIS — Z122 Encounter for screening for malignant neoplasm of respiratory organs: Secondary | ICD-10-CM | POA: Diagnosis not present

## 2022-06-26 DIAGNOSIS — D72829 Elevated white blood cell count, unspecified: Secondary | ICD-10-CM | POA: Diagnosis not present

## 2022-07-16 DIAGNOSIS — N182 Chronic kidney disease, stage 2 (mild): Secondary | ICD-10-CM | POA: Diagnosis not present

## 2022-07-22 DIAGNOSIS — Z720 Tobacco use: Secondary | ICD-10-CM | POA: Diagnosis not present

## 2022-07-22 DIAGNOSIS — I129 Hypertensive chronic kidney disease with stage 1 through stage 4 chronic kidney disease, or unspecified chronic kidney disease: Secondary | ICD-10-CM | POA: Diagnosis not present

## 2022-07-22 DIAGNOSIS — N182 Chronic kidney disease, stage 2 (mild): Secondary | ICD-10-CM | POA: Diagnosis not present

## 2022-07-22 DIAGNOSIS — E785 Hyperlipidemia, unspecified: Secondary | ICD-10-CM | POA: Diagnosis not present

## 2022-07-22 DIAGNOSIS — R809 Proteinuria, unspecified: Secondary | ICD-10-CM | POA: Diagnosis not present

## 2022-10-06 DIAGNOSIS — J209 Acute bronchitis, unspecified: Secondary | ICD-10-CM | POA: Diagnosis not present

## 2022-10-06 DIAGNOSIS — R0602 Shortness of breath: Secondary | ICD-10-CM | POA: Diagnosis not present

## 2022-10-06 DIAGNOSIS — R051 Acute cough: Secondary | ICD-10-CM | POA: Diagnosis not present

## 2022-11-26 DIAGNOSIS — I10 Essential (primary) hypertension: Secondary | ICD-10-CM | POA: Diagnosis not present

## 2022-11-26 DIAGNOSIS — Z125 Encounter for screening for malignant neoplasm of prostate: Secondary | ICD-10-CM | POA: Diagnosis not present

## 2022-11-26 DIAGNOSIS — K219 Gastro-esophageal reflux disease without esophagitis: Secondary | ICD-10-CM | POA: Diagnosis not present

## 2022-11-26 DIAGNOSIS — Z79899 Other long term (current) drug therapy: Secondary | ICD-10-CM | POA: Diagnosis not present

## 2022-11-26 DIAGNOSIS — E7800 Pure hypercholesterolemia, unspecified: Secondary | ICD-10-CM | POA: Diagnosis not present

## 2022-11-26 DIAGNOSIS — J439 Emphysema, unspecified: Secondary | ICD-10-CM | POA: Diagnosis not present

## 2022-11-26 DIAGNOSIS — R809 Proteinuria, unspecified: Secondary | ICD-10-CM | POA: Diagnosis not present

## 2022-11-26 DIAGNOSIS — I739 Peripheral vascular disease, unspecified: Secondary | ICD-10-CM | POA: Diagnosis not present

## 2022-11-26 DIAGNOSIS — Z23 Encounter for immunization: Secondary | ICD-10-CM | POA: Diagnosis not present

## 2022-12-02 DIAGNOSIS — J441 Chronic obstructive pulmonary disease with (acute) exacerbation: Secondary | ICD-10-CM | POA: Diagnosis not present

## 2022-12-02 DIAGNOSIS — E875 Hyperkalemia: Secondary | ICD-10-CM | POA: Diagnosis not present

## 2022-12-02 DIAGNOSIS — R6889 Other general symptoms and signs: Secondary | ICD-10-CM | POA: Diagnosis not present

## 2022-12-09 DIAGNOSIS — H26492 Other secondary cataract, left eye: Secondary | ICD-10-CM | POA: Diagnosis not present

## 2023-01-06 DIAGNOSIS — N182 Chronic kidney disease, stage 2 (mild): Secondary | ICD-10-CM | POA: Diagnosis not present

## 2023-01-10 DIAGNOSIS — J40 Bronchitis, not specified as acute or chronic: Secondary | ICD-10-CM | POA: Diagnosis not present

## 2023-01-13 DIAGNOSIS — I739 Peripheral vascular disease, unspecified: Secondary | ICD-10-CM | POA: Diagnosis not present

## 2023-01-13 DIAGNOSIS — R809 Proteinuria, unspecified: Secondary | ICD-10-CM | POA: Diagnosis not present

## 2023-01-13 DIAGNOSIS — I129 Hypertensive chronic kidney disease with stage 1 through stage 4 chronic kidney disease, or unspecified chronic kidney disease: Secondary | ICD-10-CM | POA: Diagnosis not present

## 2023-01-13 DIAGNOSIS — Z720 Tobacco use: Secondary | ICD-10-CM | POA: Diagnosis not present

## 2023-01-13 DIAGNOSIS — N182 Chronic kidney disease, stage 2 (mild): Secondary | ICD-10-CM | POA: Diagnosis not present

## 2023-01-21 DIAGNOSIS — L299 Pruritus, unspecified: Secondary | ICD-10-CM | POA: Diagnosis not present

## 2023-01-21 DIAGNOSIS — L300 Nummular dermatitis: Secondary | ICD-10-CM | POA: Diagnosis not present

## 2023-01-21 DIAGNOSIS — L578 Other skin changes due to chronic exposure to nonionizing radiation: Secondary | ICD-10-CM | POA: Diagnosis not present

## 2023-01-21 DIAGNOSIS — L570 Actinic keratosis: Secondary | ICD-10-CM | POA: Diagnosis not present

## 2023-01-21 DIAGNOSIS — L821 Other seborrheic keratosis: Secondary | ICD-10-CM | POA: Diagnosis not present

## 2023-02-16 DIAGNOSIS — R6889 Other general symptoms and signs: Secondary | ICD-10-CM | POA: Diagnosis not present

## 2023-02-16 DIAGNOSIS — J441 Chronic obstructive pulmonary disease with (acute) exacerbation: Secondary | ICD-10-CM | POA: Diagnosis not present

## 2023-04-09 DIAGNOSIS — N182 Chronic kidney disease, stage 2 (mild): Secondary | ICD-10-CM | POA: Diagnosis not present

## 2023-04-15 DIAGNOSIS — Z720 Tobacco use: Secondary | ICD-10-CM | POA: Diagnosis not present

## 2023-04-15 DIAGNOSIS — J449 Chronic obstructive pulmonary disease, unspecified: Secondary | ICD-10-CM | POA: Diagnosis not present

## 2023-04-15 DIAGNOSIS — N182 Chronic kidney disease, stage 2 (mild): Secondary | ICD-10-CM | POA: Diagnosis not present

## 2023-04-15 DIAGNOSIS — I129 Hypertensive chronic kidney disease with stage 1 through stage 4 chronic kidney disease, or unspecified chronic kidney disease: Secondary | ICD-10-CM | POA: Diagnosis not present

## 2023-04-15 DIAGNOSIS — R809 Proteinuria, unspecified: Secondary | ICD-10-CM | POA: Diagnosis not present

## 2023-06-01 DIAGNOSIS — Z79899 Other long term (current) drug therapy: Secondary | ICD-10-CM | POA: Diagnosis not present

## 2023-06-01 DIAGNOSIS — R634 Abnormal weight loss: Secondary | ICD-10-CM | POA: Diagnosis not present

## 2023-06-01 DIAGNOSIS — I10 Essential (primary) hypertension: Secondary | ICD-10-CM | POA: Diagnosis not present

## 2023-06-01 DIAGNOSIS — Z1331 Encounter for screening for depression: Secondary | ICD-10-CM | POA: Diagnosis not present

## 2023-06-01 DIAGNOSIS — J439 Emphysema, unspecified: Secondary | ICD-10-CM | POA: Diagnosis not present

## 2023-06-01 DIAGNOSIS — K219 Gastro-esophageal reflux disease without esophagitis: Secondary | ICD-10-CM | POA: Diagnosis not present

## 2023-06-01 DIAGNOSIS — Z9181 History of falling: Secondary | ICD-10-CM | POA: Diagnosis not present

## 2023-06-01 DIAGNOSIS — E7800 Pure hypercholesterolemia, unspecified: Secondary | ICD-10-CM | POA: Diagnosis not present

## 2023-06-01 DIAGNOSIS — Z23 Encounter for immunization: Secondary | ICD-10-CM | POA: Diagnosis not present

## 2023-06-01 DIAGNOSIS — I739 Peripheral vascular disease, unspecified: Secondary | ICD-10-CM | POA: Diagnosis not present

## 2023-06-01 DIAGNOSIS — R809 Proteinuria, unspecified: Secondary | ICD-10-CM | POA: Diagnosis not present

## 2023-06-01 DIAGNOSIS — B079 Viral wart, unspecified: Secondary | ICD-10-CM | POA: Diagnosis not present

## 2023-06-18 DIAGNOSIS — Z122 Encounter for screening for malignant neoplasm of respiratory organs: Secondary | ICD-10-CM | POA: Diagnosis not present

## 2023-06-18 DIAGNOSIS — R911 Solitary pulmonary nodule: Secondary | ICD-10-CM | POA: Diagnosis not present

## 2023-06-18 DIAGNOSIS — Z87891 Personal history of nicotine dependence: Secondary | ICD-10-CM | POA: Diagnosis not present

## 2023-06-18 DIAGNOSIS — I2584 Coronary atherosclerosis due to calcified coronary lesion: Secondary | ICD-10-CM | POA: Diagnosis not present

## 2023-09-26 DIAGNOSIS — R0981 Nasal congestion: Secondary | ICD-10-CM | POA: Diagnosis not present

## 2023-09-26 DIAGNOSIS — R051 Acute cough: Secondary | ICD-10-CM | POA: Diagnosis not present

## 2023-09-26 DIAGNOSIS — J029 Acute pharyngitis, unspecified: Secondary | ICD-10-CM | POA: Diagnosis not present

## 2023-09-26 DIAGNOSIS — J069 Acute upper respiratory infection, unspecified: Secondary | ICD-10-CM | POA: Diagnosis not present

## 2023-09-26 DIAGNOSIS — R509 Fever, unspecified: Secondary | ICD-10-CM | POA: Diagnosis not present

## 2023-09-26 DIAGNOSIS — J441 Chronic obstructive pulmonary disease with (acute) exacerbation: Secondary | ICD-10-CM | POA: Diagnosis not present

## 2023-10-05 DIAGNOSIS — J441 Chronic obstructive pulmonary disease with (acute) exacerbation: Secondary | ICD-10-CM | POA: Diagnosis not present

## 2023-10-05 DIAGNOSIS — R6889 Other general symptoms and signs: Secondary | ICD-10-CM | POA: Diagnosis not present

## 2023-10-09 DIAGNOSIS — N182 Chronic kidney disease, stage 2 (mild): Secondary | ICD-10-CM | POA: Diagnosis not present

## 2023-10-12 DIAGNOSIS — K219 Gastro-esophageal reflux disease without esophagitis: Secondary | ICD-10-CM | POA: Diagnosis not present

## 2023-10-12 DIAGNOSIS — E785 Hyperlipidemia, unspecified: Secondary | ICD-10-CM | POA: Diagnosis not present

## 2023-10-12 DIAGNOSIS — R0602 Shortness of breath: Secondary | ICD-10-CM | POA: Diagnosis not present

## 2023-10-12 DIAGNOSIS — S39013A Strain of muscle, fascia and tendon of pelvis, initial encounter: Secondary | ICD-10-CM | POA: Diagnosis not present

## 2023-10-12 DIAGNOSIS — S39012A Strain of muscle, fascia and tendon of lower back, initial encounter: Secondary | ICD-10-CM | POA: Diagnosis not present

## 2023-10-12 DIAGNOSIS — Z7902 Long term (current) use of antithrombotics/antiplatelets: Secondary | ICD-10-CM | POA: Diagnosis not present

## 2023-10-12 DIAGNOSIS — M1611 Unilateral primary osteoarthritis, right hip: Secondary | ICD-10-CM | POA: Diagnosis not present

## 2023-10-12 DIAGNOSIS — Z7982 Long term (current) use of aspirin: Secondary | ICD-10-CM | POA: Diagnosis not present

## 2023-10-12 DIAGNOSIS — S76011A Strain of muscle, fascia and tendon of right hip, initial encounter: Secondary | ICD-10-CM | POA: Diagnosis not present

## 2023-10-12 DIAGNOSIS — Z79899 Other long term (current) drug therapy: Secondary | ICD-10-CM | POA: Diagnosis not present

## 2023-10-12 DIAGNOSIS — I10 Essential (primary) hypertension: Secondary | ICD-10-CM | POA: Diagnosis not present

## 2023-10-12 DIAGNOSIS — Z87891 Personal history of nicotine dependence: Secondary | ICD-10-CM | POA: Diagnosis not present

## 2023-10-22 DIAGNOSIS — J449 Chronic obstructive pulmonary disease, unspecified: Secondary | ICD-10-CM | POA: Diagnosis not present

## 2023-10-22 DIAGNOSIS — I129 Hypertensive chronic kidney disease with stage 1 through stage 4 chronic kidney disease, or unspecified chronic kidney disease: Secondary | ICD-10-CM | POA: Diagnosis not present

## 2023-10-22 DIAGNOSIS — R809 Proteinuria, unspecified: Secondary | ICD-10-CM | POA: Diagnosis not present

## 2023-10-22 DIAGNOSIS — Z720 Tobacco use: Secondary | ICD-10-CM | POA: Diagnosis not present

## 2023-10-22 DIAGNOSIS — R079 Chest pain, unspecified: Secondary | ICD-10-CM | POA: Diagnosis not present

## 2023-10-22 DIAGNOSIS — N182 Chronic kidney disease, stage 2 (mild): Secondary | ICD-10-CM | POA: Diagnosis not present

## 2023-12-02 DIAGNOSIS — E7800 Pure hypercholesterolemia, unspecified: Secondary | ICD-10-CM | POA: Diagnosis not present

## 2023-12-02 DIAGNOSIS — R809 Proteinuria, unspecified: Secondary | ICD-10-CM | POA: Diagnosis not present

## 2023-12-02 DIAGNOSIS — I739 Peripheral vascular disease, unspecified: Secondary | ICD-10-CM | POA: Diagnosis not present

## 2023-12-02 DIAGNOSIS — J439 Emphysema, unspecified: Secondary | ICD-10-CM | POA: Diagnosis not present

## 2023-12-02 DIAGNOSIS — Z9181 History of falling: Secondary | ICD-10-CM | POA: Diagnosis not present

## 2023-12-02 DIAGNOSIS — I10 Essential (primary) hypertension: Secondary | ICD-10-CM | POA: Diagnosis not present

## 2023-12-02 DIAGNOSIS — K219 Gastro-esophageal reflux disease without esophagitis: Secondary | ICD-10-CM | POA: Diagnosis not present

## 2023-12-02 DIAGNOSIS — Z125 Encounter for screening for malignant neoplasm of prostate: Secondary | ICD-10-CM | POA: Diagnosis not present

## 2023-12-02 DIAGNOSIS — R911 Solitary pulmonary nodule: Secondary | ICD-10-CM | POA: Diagnosis not present

## 2023-12-22 DIAGNOSIS — R911 Solitary pulmonary nodule: Secondary | ICD-10-CM | POA: Diagnosis not present
